# Patient Record
Sex: Male | Born: 1980 | Race: Black or African American | Hispanic: No | Marital: Married | Smoking: Current some day smoker
Health system: Southern US, Community
[De-identification: ages and names within clinical notes are randomized; demographics above are authoritative.]

## PROBLEM LIST (undated history)

## (undated) ENCOUNTER — Ambulatory Visit

## (undated) HISTORY — PX: HERNIA REPAIR: SHX51

---

## 1998-09-02 ENCOUNTER — Encounter: Payer: Self-pay | Admitting: Emergency Medicine

## 1998-09-02 ENCOUNTER — Emergency Department (HOSPITAL_COMMUNITY): Admission: EM | Admit: 1998-09-02 | Discharge: 1998-09-02 | Payer: Self-pay | Admitting: Emergency Medicine

## 1998-12-17 ENCOUNTER — Emergency Department (HOSPITAL_COMMUNITY): Admission: EM | Admit: 1998-12-17 | Discharge: 1998-12-17 | Payer: Self-pay | Admitting: Emergency Medicine

## 1998-12-17 ENCOUNTER — Encounter: Payer: Self-pay | Admitting: Emergency Medicine

## 1999-03-05 ENCOUNTER — Encounter: Payer: Self-pay | Admitting: Emergency Medicine

## 1999-03-05 ENCOUNTER — Emergency Department (HOSPITAL_COMMUNITY): Admission: EM | Admit: 1999-03-05 | Discharge: 1999-03-05 | Payer: Self-pay | Admitting: Emergency Medicine

## 2000-12-17 ENCOUNTER — Emergency Department (HOSPITAL_COMMUNITY): Admission: EM | Admit: 2000-12-17 | Discharge: 2000-12-17 | Payer: Self-pay | Admitting: Emergency Medicine

## 2001-08-23 ENCOUNTER — Emergency Department (HOSPITAL_COMMUNITY): Admission: EM | Admit: 2001-08-23 | Discharge: 2001-08-23 | Payer: Self-pay | Admitting: Emergency Medicine

## 2001-08-24 ENCOUNTER — Emergency Department (HOSPITAL_COMMUNITY): Admission: EM | Admit: 2001-08-24 | Discharge: 2001-08-24 | Payer: Self-pay | Admitting: Emergency Medicine

## 2001-12-29 ENCOUNTER — Encounter: Payer: Self-pay | Admitting: Emergency Medicine

## 2001-12-29 ENCOUNTER — Emergency Department (HOSPITAL_COMMUNITY): Admission: EM | Admit: 2001-12-29 | Discharge: 2001-12-29 | Payer: Self-pay | Admitting: *Deleted

## 2003-10-18 ENCOUNTER — Emergency Department (HOSPITAL_COMMUNITY): Admission: EM | Admit: 2003-10-18 | Discharge: 2003-10-18 | Payer: Self-pay | Admitting: Emergency Medicine

## 2008-05-03 ENCOUNTER — Emergency Department (HOSPITAL_COMMUNITY): Admission: EM | Admit: 2008-05-03 | Discharge: 2008-05-03 | Payer: Self-pay | Admitting: Family Medicine

## 2009-05-20 ENCOUNTER — Emergency Department (HOSPITAL_COMMUNITY): Admission: EM | Admit: 2009-05-20 | Discharge: 2009-05-20 | Payer: Self-pay | Admitting: Family Medicine

## 2009-10-26 ENCOUNTER — Emergency Department (HOSPITAL_COMMUNITY): Admission: EM | Admit: 2009-10-26 | Discharge: 2009-10-26 | Payer: Self-pay | Admitting: Family Medicine

## 2010-01-21 ENCOUNTER — Emergency Department (HOSPITAL_COMMUNITY): Admission: EM | Admit: 2010-01-21 | Discharge: 2010-01-22 | Payer: Self-pay | Admitting: Emergency Medicine

## 2010-01-23 ENCOUNTER — Emergency Department (HOSPITAL_COMMUNITY): Admission: EM | Admit: 2010-01-23 | Discharge: 2010-01-23 | Payer: Self-pay | Admitting: Family Medicine

## 2010-07-23 ENCOUNTER — Emergency Department (HOSPITAL_COMMUNITY): Admission: EM | Admit: 2010-07-23 | Discharge: 2010-07-23 | Payer: Self-pay | Admitting: Emergency Medicine

## 2010-09-15 ENCOUNTER — Emergency Department (HOSPITAL_COMMUNITY)
Admission: EM | Admit: 2010-09-15 | Discharge: 2010-09-15 | Payer: Self-pay | Source: Home / Self Care | Admitting: Emergency Medicine

## 2010-10-26 ENCOUNTER — Emergency Department (HOSPITAL_COMMUNITY)
Admission: EM | Admit: 2010-10-26 | Discharge: 2010-10-26 | Disposition: A | Discharge status: Home / Self Care | Payer: Self-pay | Attending: Emergency Medicine | Admitting: Emergency Medicine

## 2010-10-26 DIAGNOSIS — R197 Diarrhea, unspecified: Secondary | ICD-10-CM | POA: Insufficient documentation

## 2010-10-26 DIAGNOSIS — R112 Nausea with vomiting, unspecified: Secondary | ICD-10-CM | POA: Insufficient documentation

## 2010-10-26 LAB — COMPREHENSIVE METABOLIC PANEL
AST: 34 U/L (ref 0–37)
Albumin: 3.7 g/dL (ref 3.5–5.2)
BUN: 8 mg/dL (ref 6–23)
Calcium: 9 mg/dL (ref 8.4–10.5)
Creatinine, Ser: 1.06 mg/dL (ref 0.4–1.5)
GFR calc Af Amer: >60 mL/min (ref >60)
Total Protein: 7 g/dL (ref 6.0–8.3)

## 2010-10-26 LAB — CBC
HCT: 42.7 % (ref 39.0–52.0)
Hemoglobin: 14.4 g/dL (ref 13.0–17.0)
MCH: 27.7 pg (ref 26.0–34.0)
MCHC: 33.7 g/dL (ref 30.0–36.0)
RBC: 5.19 MIL/uL (ref 4.22–5.81)

## 2010-10-26 LAB — DIFFERENTIAL
Lymphocytes Relative: 49 % — ABNORMAL HIGH (ref 12–46)
Monocytes Absolute: 0.3 10*3/uL (ref 0.1–1.0)
Monocytes Relative: 8 % (ref 3–12)
Neutro Abs: 1.4 10*3/uL — ABNORMAL LOW (ref 1.7–7.7)
Neutrophils Relative %: 36 % — ABNORMAL LOW (ref 43–77)

## 2011-10-09 ENCOUNTER — Emergency Department (HOSPITAL_COMMUNITY): Payer: Self-pay

## 2011-10-09 ENCOUNTER — Encounter (HOSPITAL_COMMUNITY): Payer: Self-pay | Admitting: Emergency Medicine

## 2011-10-09 ENCOUNTER — Emergency Department (HOSPITAL_COMMUNITY)
Admission: EM | Admit: 2011-10-09 | Discharge: 2011-10-09 | Disposition: A | Discharge status: Home / Self Care | Payer: Self-pay | Attending: Emergency Medicine | Admitting: Emergency Medicine

## 2011-10-09 DIAGNOSIS — R1110 Vomiting, unspecified: Secondary | ICD-10-CM | POA: Insufficient documentation

## 2011-10-09 DIAGNOSIS — Z79899 Other long term (current) drug therapy: Secondary | ICD-10-CM | POA: Insufficient documentation

## 2011-10-09 DIAGNOSIS — R070 Pain in throat: Secondary | ICD-10-CM | POA: Insufficient documentation

## 2011-10-09 DIAGNOSIS — R05 Cough: Secondary | ICD-10-CM | POA: Insufficient documentation

## 2011-10-09 DIAGNOSIS — R059 Cough, unspecified: Secondary | ICD-10-CM | POA: Insufficient documentation

## 2011-10-09 DIAGNOSIS — J40 Bronchitis, not specified as acute or chronic: Secondary | ICD-10-CM | POA: Insufficient documentation

## 2011-10-09 DIAGNOSIS — J45909 Unspecified asthma, uncomplicated: Secondary | ICD-10-CM | POA: Insufficient documentation

## 2011-10-09 MED ORDER — AZITHROMYCIN 250 MG PO TABS: 250.0000 mg | ORAL_TABLET | Freq: Every day | ORAL | Status: DC

## 2011-10-09 MED ORDER — HYDROCOD POLST-CHLORPHEN POLST 10-8 MG/5ML PO LQCR: 5.0000 mL | Freq: Every evening | ORAL | Status: DC | PRN

## 2011-10-09 MED ORDER — HYDROCOD POLST-CHLORPHEN POLST 10-8 MG/5ML PO LQCR
5.0000 mL | Freq: Once | ORAL | Status: AC
  Administered 2011-10-09: 5 mL via ORAL
  Filled 2011-10-09: qty 5

## 2011-10-09 MED ORDER — AZITHROMYCIN 250 MG PO TABS: 250.0000 mg | ORAL_TABLET | Freq: Every day | ORAL | Status: AC

## 2011-10-09 MED ORDER — AZITHROMYCIN 250 MG PO TABS
250.0000 mg | ORAL_TABLET | Freq: Once | ORAL | Status: AC
  Administered 2011-10-09: 250 mg via ORAL
  Filled 2011-10-09: qty 1

## 2011-10-09 NOTE — ED Provider Notes (Signed)
History     CSN: [id number]  Arrival date & time 10/09/11  1955   First MD Initiated Contact with Patient 10/09/11 2204      Chief Complaint  Patient presents with  . URI    (Consider location/radiation/quality/duration/timing/severity/associated sxs/prior treatment) HPI  Pt presents to the ED with complaints of flu-like symptoms of cough,  sore throat, and vomiting after coughing. The patient states that the symptoms started 3 weeks ago.  Pt has been around other sick contacts and did not get the flu shot this year. The patient denies headaches, neck pain, weakness, vision changes, severe abdominal pain, inability to eat or drink, difficulty breathing, SOB, wheezing, chest pain. The patient has tried cough medicine, NSAIDS, and rest but has only felt mild relief.    Past Medical History  Diagnosis Date  . Asthma     History reviewed. No pertinent past surgical history.  No family history on file.  History  Substance Use Topics  . Smoking status: Current Everyday Smoker -- 1.0 packs/day    Types: Cigarettes  . Smokeless tobacco: Not on file  . Alcohol Use: No      Review of Systems  All other systems reviewed and are negative.    Allergies  Review of patient's allergies indicates no known allergies.  Home Medications   Current Outpatient Rx  Name Route Sig Dispense Refill  . AZITHROMYCIN 250 MG PO TABS Oral Take 1 tablet (250 mg total) by mouth daily. Take first 2 tablets together, then 1 every day until finished. 5 tablet 0  . HYDROCOD POLST-CPM POLST ER 10-8 MG/5ML PO LQCR Oral Take 5 mLs by mouth at bedtime as needed. 140 mL 0    BP 120/71  Pulse 76  Temp(Src) 98 F (36.7 C) (Oral)  Resp 16  Wt 215 lb (97.523 kg)  SpO2 99%  Physical Exam  Nursing note and vitals reviewed. Constitutional: He is oriented to person, place, and time. He appears well-developed and well-nourished.  HENT:  Head: Normocephalic and atraumatic.  Eyes: Conjunctivae are  normal. Pupils are equal, round, and reactive to light.  Neck: Normal range of motion. Neck supple.  Cardiovascular: Normal rate and regular rhythm.   Pulmonary/Chest: Effort normal and breath sounds normal. No respiratory distress. He has no wheezes. He has no rales. He exhibits no tenderness.  Abdominal: Soft.  Musculoskeletal: Normal range of motion.  Neurological: He is oriented to person, place, and time.  Skin: Skin is warm and dry.    ED Course  Procedures (including critical care time)  Labs Reviewed - No data to display Dg Chest 2 View  10/09/2011  *RADIOLOGY REPORT*  Clinical Data: Cough  CHEST - 2 VIEW  Comparison: 07/23/2010; 10/18/2003  Findings: Unchanged cardiac silhouette and mediastinal contours. There is mild diffuse increased conspicuity of the pulmonary interstitium.  No focal airspace opacities.  No pleural effusion or pneumothorax. Mild scoliotic curvature of the thoracolumbar spine, possibly positional.  No acute osseous abnormalities.  IMPRESSION: Findings suggestive of airways disease/bronchitis.  No focal airspace opacities to suggest pneumonia.  Original Report Authenticated By: Waynard Reeds, M.D.     1. Bronchitis       MDM  Pt given first dose of cough medication and abx (azithro) in ED. Pt sent home with Rx for tussionex and azithro for 6 days.        Dorthula Matas, PA 10/09/11 2237

## 2011-10-09 NOTE — Discharge Instructions (Signed)

## 2011-10-09 NOTE — ED Notes (Signed)
Pt alert, nad, c/o cough, URI s/s, onset several days ago, resp eve ununlabored, dry npc noted, skin pwd

## 2011-10-13 NOTE — ED Provider Notes (Signed)
History/physical exam/procedure(s) were performed by non-physician practitioner and as supervising physician I was immediately available for consultation/collaboration. I have reviewed all notes and am in agreement with care and plan.   Hilario Quarry, MD 10/13/11 [phone]

## 2012-03-31 ENCOUNTER — Encounter (HOSPITAL_COMMUNITY): Payer: Self-pay | Admitting: *Deleted

## 2012-03-31 ENCOUNTER — Emergency Department (HOSPITAL_COMMUNITY): Payer: Self-pay

## 2012-03-31 ENCOUNTER — Emergency Department (HOSPITAL_COMMUNITY)
Admission: EM | Admit: 2012-03-31 | Discharge: 2012-03-31 | Disposition: A | Discharge status: Home / Self Care | Payer: Self-pay | Attending: Emergency Medicine | Admitting: Emergency Medicine

## 2012-03-31 DIAGNOSIS — S6390XA Sprain of unspecified part of unspecified wrist and hand, initial encounter: Secondary | ICD-10-CM | POA: Insufficient documentation

## 2012-03-31 DIAGNOSIS — S60229A Contusion of unspecified hand, initial encounter: Secondary | ICD-10-CM | POA: Insufficient documentation

## 2012-03-31 DIAGNOSIS — IMO0002 Reserved for concepts with insufficient information to code with codable children: Secondary | ICD-10-CM | POA: Insufficient documentation

## 2012-03-31 DIAGNOSIS — S60519A Abrasion of unspecified hand, initial encounter: Secondary | ICD-10-CM

## 2012-03-31 DIAGNOSIS — J45909 Unspecified asthma, uncomplicated: Secondary | ICD-10-CM | POA: Insufficient documentation

## 2012-03-31 DIAGNOSIS — F17200 Nicotine dependence, unspecified, uncomplicated: Secondary | ICD-10-CM | POA: Insufficient documentation

## 2012-03-31 MED ORDER — IBUPROFEN 800 MG PO TABS: 800.0000 mg | ORAL_TABLET | Freq: Three times a day (TID) | ORAL | Status: AC | PRN

## 2012-03-31 MED ORDER — HYDROCODONE-ACETAMINOPHEN 5-325 MG PO TABS: ORAL_TABLET | ORAL | Status: AC

## 2012-03-31 MED ORDER — TETANUS-DIPHTH-ACELL PERTUSSIS 5-2.5-18.5 LF-MCG/0.5 IM SUSP
0.5000 mL | Freq: Once | INTRAMUSCULAR | Status: AC
  Administered 2012-03-31: 0.5 mL via INTRAMUSCULAR
  Filled 2012-03-31: qty 0.5

## 2012-03-31 MED ORDER — AMOXICILLIN-POT CLAVULANATE 875-125 MG PO TABS: 1.0000 | ORAL_TABLET | Freq: Two times a day (BID) | ORAL | Status: AC

## 2012-03-31 MED ORDER — HYDROCODONE-ACETAMINOPHEN 5-325 MG PO TABS
2.0000 | ORAL_TABLET | Freq: Once | ORAL | Status: AC
  Administered 2012-03-31: 2 via ORAL
  Filled 2012-03-31: qty 2

## 2012-03-31 MED ORDER — AMOXICILLIN-POT CLAVULANATE 875-125 MG PO TABS
1.0000 | ORAL_TABLET | Freq: Once | ORAL | Status: AC
  Administered 2012-03-31: 1 via ORAL
  Filled 2012-03-31: qty 1
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
## 2012-03-31 NOTE — ED Provider Notes (Signed)
Medical screening examination/treatment/procedure(s) were performed by non-physician practitioner and as supervising physician I was immediately available for consultation/collaboration.   Andreka Stucki, MD 03/31/12 2230 
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
## 2012-03-31 NOTE — Discharge Instructions (Signed)
Please read and follow all provided instructions.  Your diagnoses today include:  1. Contusion of hand   2. Sprain of hand   3. Abrasion of hand     Tests performed today include:  An x-ray of your hand and forearm - does NOT show any broken bones  Vital signs. See below for your results today.   Medications prescribed:   Augmentin - antibiotic  You have been prescribed an antibiotic medicine: take the entire course of medicine even if you are feeling better. Stopping early can cause the antibiotic not to work.   Vicodin (hydrocodone/acetaminophen) - narcotic pain medication  DO NOT drive or perform any activities that require you to be awake and alert because this medicine can make you drowsy. BE VERY CAREFUL not to take multiple medicines containing Tylenol (also called acetaminophen). Doing so can lead to an overdose which can damage your liver and cause liver failure and possibly death.   Ibuprofen (Motrin, Advil) - anti-inflammatory pain medication  Do not exceed 800mg  ibuprofen every 8 hours, take with food  You have been prescribed an anti-inflammatory medication or NSAID. Take with food. Take smallest effective dose for the shortest duration needed for your pain. Stop taking if you experience stomach pain or vomiting.   Take any prescribed medications only as directed.  Home care instructions:   Follow any educational materials contained in this packet  Wear your splint for at least one week or until seen by a physician for a follow-up examination.  Follow R.I.C.E. Protocol:  R - rest your injury   I  - use ice on injury without applying directly to skin  C - compress injury with bandage or splint  E - elevate the injury above the level of your heart as much as possible to reduce pain and swelling  Follow-up instructions: Please follow-up with your primary care provider or the provided orthopedic (bone specialist) if you continue to have significant pain or  trouble using your wrist in 1 week. In this case you may have a severe injury that requires further care.   Generally, when wrists are moderately tender to touch following a fall or injury, a fracture (break in bone) may be present. Because of this, even if your x-rays were normal today, it is important that you receive follow-up care as suggested (you could still have a broken bone).  If you do not have a primary care doctor -- see below for referral information.   Return instructions:   Please return if your fingers are numb or tingling, appear very red, white, gray or blue, or you have severe pain (also elevate wrist and loosen splint or wrap)  Please return if you have difficulty moving your fingers.  Return with you have increasing pain, swelling, redness, or pus draining from the wound on your knuckle  Please return to the Emergency Department if you experience worsening symptoms.   Please return if you have any other emergent concerns.  Additional Information:  Your vital signs today were: BP 118/74   Pulse 84   Temp 98 F (36.7 C) (Oral)   Resp 16   SpO2 99% If your blood pressure (BP) was elevated above 135/85 this visit, please have this repeated by your doctor within one month. -------------- Wrist injuries are frequent in adults and children. A sprain is an injury to the ligaments that hold your bones together. A strain is an injury to muscle or muscle tendons (cord like structure) from stretching or  pulling.   Remember the importance of follow-up and possible follow-up x-rays. Improvement in pain level is not 100% insurance of not having a fracture. -------------- RESOURCE GUIDE  Chronic Pain Problems:  [facility]:  [phone]  Patients need to be referred by their primary care doctor  Insufficient Money for Medicine:  [employer]:     call "211"  [facility]:  602-030-2337  No Primary Care Doctor:  To locate a primary care doctor that  accepts your insurance or provides certain services:  [facility] :   [phone]  Physician Referral Service:  [phone]  Agencies that provide inexpensive medical care:  [facility]:   [phone]  [facility]:   [phone]  [facility]:   [phone]  Women's Clinic:     [phone]  Planned Parenthood:    [phone]  [facility]:     [phone]  Medicaid-accepting [facility] Providers:  [facility]  [address], [phone], Mon-Fri 9am-7pm, Sat 9am-1pm  [facility]  [address] [place], [address] [place], [phone]  [facility]  [address] [place], [phone]  [facility]  [address] [phone]  Renaye Rakers  [address], [phone]  Only accepts [place] [employer] patients after they have their name  applied to their card  Self Pay (no insurance) in [facility]:  Sickle Cell Patients: Dr. Willey Blade, [facility]  [address] [place], [phone]  [facility]  [address] [place], [phone]  [facility] [place]  [address] [place] [facility] [address], [facility] - [address]  [phone], Mon-Fri 9am-7pm, Sat 9am-1pm  [facility]  [address] [place], [phone]  [facility]  [address], [phone]  [facility]  [address] [phone]  Dr. Julio Sicks  [address] [place] [address], [place], [phone]  [facility]  [address] [phone]  [facility]  [address] [phone]  [address] [phone]  [facility]  [address] [place], [phone], 1st & 3rd Saturday every month, 10am-1pm  Strategies for finding a Primary Care Provider:  1) Find a Doctor and Pay Out of Pocket Although you  won't have to find out who is covered by your insurance plan, it is a good idea to ask around and get recommendations. You will then need to call the office and see if the doctor you have chosen will accept you as a new patient and what types of options they offer for patients who are self-pay. Some doctors offer discounts or will set up payment plans for their patients who do not have insurance, but you will need to ask so you aren't surprised when you get to your appointment.  2) Contact Your Local Health Department Not all health departments have doctors that can see patients for sick visits, but many do, so it is worth a call to see if yours does. If you don't know where your local health department is, you can check in your phone book. The CDC also has a tool to help you locate your state's health department, and many state websites also have listings of all of their local health departments.  3) Find a [employer]  If your illness is not likely to be very severe or complicated, you may want to try a walk in clinic. These are popping up all over the country in pharmacies, drugstores, and shopping centers. They're usually staffed by nurse practitioners or physician assistants that have been trained to treat common illnesses and complaints. They're usually fairly quick and inexpensive. However, if you have serious medical issues or chronic medical problems, these are probably not your best option  STD Testing:  [facility] [place], [place] [facility]  [address] [place], phone [phone] or [phone]    Monday - Friday, call for an appointment  [facility] Department of [facility], [place] [facility]  [address], [place], phone [phone] or [phone]   Monday - Friday, call for an appointment  Abuse/Neglect:  [facility] Child Abuse Hotline:  949-732-1722  [facility] Child Abuse Hotline: 5701573583 (After  Hours)  Emergency Shelter:  [facility] (779)125-0554  Maternity Homes:  [facility] [place] [facility]: (906)197-0915  [facility] Services: 330-025-7502  MRSA Hotline:   859-416-6837   [facility]  [address] [place]  [phone]   [place]  [address] [place], [place]  [phone]   [facility] Dept.  [address] [place] [address], [place]  [phone]   [facility]  [phone]   [facility]  [phone]   [facility] in [place]  [address]  [phone], [facility] Child Abuse Hotline  (434)658-4954  201-391-9064 (After Hours)   Behavioral Health Services  Substance Abuse Resources:  Alcohol and Drug Services:     702-303-2912  [facility]:   [phone]  [facility]:     [phone]  [facility]:      [phone]  Residential & Outpatient Substance Abuse Program: (734)761-7272  Psychological Services:  [facility]:   [phone]  [facility] Services:    [phone]  [facility]  [address], [place]  ACCESS LINE: (802)192-9188 or 6104886589  [employer]  Dental Assistance: If unable to pay or uninsured, contact:  [facility] or [facility]. to become qualified for the adult dental clinic.  Patients with Medicaid  [facility]  [phone] [address], [phone]  [address] [phone]  If unable to pay, or uninsured: contact [facility] [phone]) or [facility] Department [phone] in [place], [phone] in [facility]) to become qualified for the adult dental clinic  Other Low-Cost Community Dental Services:  [facility]  [address] [place], [place], [place], [address]  [phone],  [phone]  2nd and 4th Thursday of the month at [facility]  [facility]  [address] [place], [place], [place], [address]  [phone]  [facility]  [address] [place], [place], [address]  [phone]  [facility] Health Department  [phone]  [facility]  [phone]  [facility]  [phone]

## 2013-04-24 ENCOUNTER — Encounter (HOSPITAL_COMMUNITY): Payer: Self-pay | Admitting: *Deleted

## 2013-04-24 ENCOUNTER — Emergency Department (HOSPITAL_COMMUNITY)
Admission: EM | Admit: 2013-04-24 | Discharge: 2013-04-24 | Disposition: A | Discharge status: Home / Self Care | Payer: Self-pay | Attending: Emergency Medicine | Admitting: Emergency Medicine

## 2013-04-24 DIAGNOSIS — F17200 Nicotine dependence, unspecified, uncomplicated: Secondary | ICD-10-CM | POA: Insufficient documentation

## 2013-04-24 DIAGNOSIS — L02619 Cutaneous abscess of unspecified foot: Secondary | ICD-10-CM | POA: Insufficient documentation

## 2013-04-24 DIAGNOSIS — L539 Erythematous condition, unspecified: Secondary | ICD-10-CM | POA: Insufficient documentation

## 2013-04-24 DIAGNOSIS — J45909 Unspecified asthma, uncomplicated: Secondary | ICD-10-CM | POA: Insufficient documentation

## 2013-04-24 DIAGNOSIS — L03039 Cellulitis of unspecified toe: Secondary | ICD-10-CM | POA: Insufficient documentation

## 2013-04-24 DIAGNOSIS — R609 Edema, unspecified: Secondary | ICD-10-CM | POA: Insufficient documentation

## 2013-04-24 DIAGNOSIS — M7750 Other enthesopathy of unspecified foot: Secondary | ICD-10-CM | POA: Insufficient documentation

## 2013-04-24 MED ORDER — SULFAMETHOXAZOLE-TRIMETHOPRIM 800-160 MG PO TABS: 1.0000 | ORAL_TABLET | Freq: Two times a day (BID) | ORAL | Status: DC

## 2013-04-24 MED ORDER — NAPROXEN 500 MG PO TABS: 500.0000 mg | ORAL_TABLET | Freq: Two times a day (BID) | ORAL | Status: DC

## 2013-04-24 NOTE — ED Provider Notes (Signed)
Medical screening examination/treatment/procedure(s) were performed by non-physician practitioner and as supervising physician I was immediately available for consultation/collaboration.   Benny Lennert, MD 04/24/13 [phone]

## 2013-04-24 NOTE — ED Notes (Signed)
C/o left 3rd toe pain onset yesterday - denies injury.  Reports now entire left foot is painful/sore.

## 2013-04-24 NOTE — ED Provider Notes (Signed)
CSN: [id number]     Arrival date & time 04/24/13  0808 History   None    Chief Complaint  Patient presents with  . Foot Pain   (Consider location/radiation/quality/duration/timing/severity/associated sxs/prior Treatment) HPI Comments: Chris Glover is a 32 y.o. male who presents to the Emergency Department complaining of pain to the left third toe.  Noticed pain, swelling and redness to the toe yesterday.  Denies known injury.  States that the shoes he wears to work "rub" his toe.  He denies numbness, open wounds to the foot or toe, or red streaks.  He soaked his foot in warm epsom salt last evening which helped the pain.  No h/x of DM  Patient is a 32 y.o. male presenting with lower extremity pain. The history is provided by the patient.  Foot Pain This is a new problem. The current episode started yesterday. The problem occurs constantly. The problem has been unchanged. Associated symptoms include arthralgias and joint swelling. Pertinent negatives include no chills, fever, nausea, numbness, rash, vomiting or weakness. The symptoms are aggravated by walking and standing. He has tried nothing for the symptoms. The treatment provided no relief.    Past Medical History  Diagnosis Date  . Asthma    Past Surgical History  Procedure Laterality Date  . Hernia repair     No family history on file. History  Substance Use Topics  . Smoking status: Current Every Day Smoker -- 1.00 packs/day    Types: Cigarettes  . Smokeless tobacco: Not on file  . Alcohol Use: Yes    Review of Systems  Constitutional: Negative for fever and chills.  Gastrointestinal: Negative for nausea and vomiting.  Genitourinary: Negative for dysuria and difficulty urinating.  Musculoskeletal: Positive for joint swelling and arthralgias. Negative for gait problem.  Skin: Negative for color change, rash and wound.  Neurological: Negative for weakness and numbness.  All other systems reviewed and are  negative.    Allergies  Review of patient's allergies indicates no known allergies.  Home Medications  No current outpatient prescriptions on file.  BP 128/86  Temp(Src) 97.7 F (36.5 C) (Oral)  Ht 6\' 1"  (1.854 m)  Wt 205 lb (92.987 kg)  BMI 27.05 kg/m2  SpO2 100%  Physical Exam  Nursing note and vitals reviewed. Constitutional: He is oriented to person, place, and time.  Cardiovascular: Normal rate, regular rhythm, normal heart sounds and intact distal pulses.   No murmur heard. Pulmonary/Chest: Effort normal and breath sounds normal. No respiratory distress.  Musculoskeletal: He exhibits edema and tenderness.       Right foot: He exhibits tenderness and swelling. He exhibits normal range of motion, no bony tenderness, normal capillary refill, no crepitus, no deformity and no laceration.       Feet:  Localized ttp of the dorsal surface of the left third toe.  Mild STS and erythema present.  No wounds, open lesions, drainage, paronychia, or red streaking.  DP pulse brisk, distal sensation intact.  No proximal tenderness.    Neurological: He is alert and oriented to person, place, and time. He exhibits normal muscle tone. Coordination normal.  Skin: Skin is warm and dry. No rash noted.  See MS exam    ED Course  Procedures (including critical care time)  DIAGNOSTIC STUDIES: Oxygen Saturation is 100% on RA, normal by my interpretation.    COORDINATION OF CARE: 8:23 AM-Discussed treatment plan which includes  (X-ray) with pt at bedside and pt agreed to plan.  Labs Review Labs Reviewed - No data to display Imaging Review No results found.  MDM    Localized erythema and mild STS of the dorsal third toe of the left foot.  Possible irritation caused by poor fitting shoes, but also early cellulitis of the toe.  Will treat with naprosyn and Bactrim.  Pt agrees to elevate and continue warm water soaks.  He agrees to return if the sx's worsen.  VSS.  He appears stable for  discharge.   Vidur Knust L. Riaan Toledo, PA-C 04/24/13 0842  Dow Blahnik L. Trisha Mangle, PA-C 04/24/13 [phone]

## 2013-04-24 NOTE — ED Notes (Signed)
Instructions, prescriptions and f/u information given/reviewed - verbalizes understanding.  

## 2013-05-11 ENCOUNTER — Emergency Department (HOSPITAL_COMMUNITY)
Admission: EM | Admit: 2013-05-11 | Discharge: 2013-05-11 | Disposition: A | Discharge status: Home / Self Care | Payer: Self-pay | Attending: Emergency Medicine | Admitting: Emergency Medicine

## 2013-05-11 ENCOUNTER — Encounter (HOSPITAL_COMMUNITY): Payer: Self-pay

## 2013-05-11 DIAGNOSIS — R509 Fever, unspecified: Secondary | ICD-10-CM | POA: Insufficient documentation

## 2013-05-11 DIAGNOSIS — R1110 Vomiting, unspecified: Secondary | ICD-10-CM

## 2013-05-11 DIAGNOSIS — R197 Diarrhea, unspecified: Secondary | ICD-10-CM | POA: Insufficient documentation

## 2013-05-11 DIAGNOSIS — J45909 Unspecified asthma, uncomplicated: Secondary | ICD-10-CM | POA: Insufficient documentation

## 2013-05-11 DIAGNOSIS — R109 Unspecified abdominal pain: Secondary | ICD-10-CM | POA: Insufficient documentation

## 2013-05-11 DIAGNOSIS — R112 Nausea with vomiting, unspecified: Secondary | ICD-10-CM | POA: Insufficient documentation

## 2013-05-11 DIAGNOSIS — Z79899 Other long term (current) drug therapy: Secondary | ICD-10-CM | POA: Insufficient documentation

## 2013-05-11 DIAGNOSIS — F17200 Nicotine dependence, unspecified, uncomplicated: Secondary | ICD-10-CM | POA: Insufficient documentation

## 2013-05-11 NOTE — ED Notes (Signed)
Pt reports being sick w/ nausea, vomiting and diarrhea since Saturday.  Stated he is getting better, but wanted to get checked out. Has to go back to work this week.  Denies any fever.  Son has been sick with the same thing

## 2013-05-11 NOTE — ED Provider Notes (Signed)
CSN: [id number]     Arrival date & time 05/11/13  4098 History  This chart was scribed for Candyce Churn, MD by Quintella Reichert, ED scribe.  This patient was seen in room APA03/APA03 and the patient's care was started at 7:46 AM.   Chief Complaint  Patient presents with  . Nausea  . Emesis  . Diarrhea    Patient is a 32 y.o. male presenting with vomiting. The history is provided by the patient. No language interpreter was used.  Emesis Severity:  Moderate Duration:  5 days Timing:  Intermittent Emesis appearance: Not bloody. Able to tolerate:  Solids and liquids (Presently.  Earlier unable to tolerate solids.) Progression:  Partially resolved Chronicity:  New Relieved by:  Nothing Worsened by:  Nothing tried Ineffective treatments:  None tried Associated symptoms: abdominal pain, diarrhea and fever (possible transient low-grade fever, not measured)   Risk factors: sick contacts     HPI Comments: Chris Glover is a 32 y.o. male who presents to the Emergency Department complaining of 4-5 days of gradually-improving nausea, vomiting and diarrhea.  Pt reports that his son was recently sick with the same symptoms.  He states his symptoms are improving but he still wants to get checked   His last episode of vomiting and diarrhea was last night and he has had none since then.  He denies blood in vomit or stool.   He states he may have had a low-grade fever 4 days ago but none since then.  He also notes that he had some abdominal pain that has since resolved.  He denies lightheadedness.  Pt was unable to tolerate food earlier but states this has also resolved.  He has been staying well-hydrated.  Pt notes that he has to go back to work this week.  He is a [employer].     Past Medical History  Diagnosis Date  . Asthma     Past Surgical History  Procedure Laterality Date  . Hernia repair      No family history on file.   History  Substance Use Topics  . Smoking status:  Current Every Day Smoker -- 1.00 packs/day    Types: Cigarettes  . Smokeless tobacco: Not on file  . Alcohol Use: Yes     Review of Systems  Constitutional: Positive for fever.  Gastrointestinal: Positive for nausea, vomiting, abdominal pain and diarrhea. Negative for blood in stool.  Neurological: Negative for light-headedness.  All other systems reviewed and are negative.     Allergies  Review of patient's allergies indicates no known allergies.  Home Medications   Current Outpatient Rx  Name  Route  Sig  Dispense  Refill  . naproxen (NAPROSYN) 500 MG tablet   Oral   Take 1 tablet (500 mg total) by mouth 2 (two) times daily with a meal.   20 tablet   0   . sulfamethoxazole-trimethoprim (SEPTRA DS) 800-160 MG per tablet   Oral   Take 1 tablet by mouth 2 (two) times daily.   20 tablet   0    BP 133/86  Pulse 70  Temp(Src) 97.8 F (36.6 C) (Oral)  Resp 18  Ht 6\' 1"  (1.854 m)  Wt 214 lb (97.07 kg)  BMI 28.24 kg/m2  SpO2 99%  Physical Exam  Nursing note and vitals reviewed. Constitutional: He is oriented to person, place, and time. He appears well-developed and well-nourished. No distress.  HENT:  Head: Normocephalic and atraumatic.  Mouth/Throat: Oropharynx is  clear and moist.  Eyes: Conjunctivae are normal. Pupils are equal, round, and reactive to light. No scleral icterus.  Neck: Normal range of motion. Neck supple.  Cardiovascular: Normal rate, regular rhythm, normal heart sounds and intact distal pulses.   No murmur heard. Pulmonary/Chest: Effort normal and breath sounds normal. No stridor. No respiratory distress. He has no wheezes. He has no rales.  Abdominal: Soft. Bowel sounds are normal. He exhibits no distension. There is no tenderness. There is no rebound and no guarding.  Musculoskeletal: Normal range of motion. He exhibits no edema.  Neurological: He is alert and oriented to person, place, and time.  Skin: Skin is warm and dry. No rash noted.   Psychiatric: He has a normal mood and affect. His behavior is normal.    ED Course  Procedures (including critical care time)  DIAGNOSTIC STUDIES: Oxygen Saturation is 99% on room air, normal by my interpretation.    COORDINATION OF CARE: 7:55 AM-Discussed treatment plan with pt at bedside and pt agreed to plan.    Labs Review Labs Reviewed - No data to display  Imaging Review No results found.  MDM   1. Vomiting and diarrhea    Well-appearing 32 year old male with vomiting and diarrhea for several days. Last episode of fever was yesterday. He states he feels much better today. Tolerating POs. Abdomen soft and nontender.    I personally performed the services described in this documentation, which was scribed in my presence. The recorded information has been reviewed and is accurate.     Candyce Churn, MD 05/11/13 [phone]
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
## 2014-09-05 ENCOUNTER — Emergency Department (HOSPITAL_COMMUNITY)
Admission: EM | Admit: 2014-09-05 | Discharge: 2014-09-05 | Disposition: A | Discharge status: Home / Self Care | Payer: Self-pay | Attending: Emergency Medicine | Admitting: Emergency Medicine

## 2014-09-05 ENCOUNTER — Encounter (HOSPITAL_COMMUNITY): Payer: Self-pay | Admitting: *Deleted

## 2014-09-05 DIAGNOSIS — Z791 Long term (current) use of non-steroidal anti-inflammatories (NSAID): Secondary | ICD-10-CM | POA: Insufficient documentation

## 2014-09-05 DIAGNOSIS — K047 Periapical abscess without sinus: Secondary | ICD-10-CM | POA: Insufficient documentation

## 2014-09-05 DIAGNOSIS — J45909 Unspecified asthma, uncomplicated: Secondary | ICD-10-CM | POA: Insufficient documentation

## 2014-09-05 DIAGNOSIS — Z720 Tobacco use: Secondary | ICD-10-CM | POA: Insufficient documentation

## 2014-09-05 DIAGNOSIS — K029 Dental caries, unspecified: Secondary | ICD-10-CM | POA: Insufficient documentation

## 2014-09-05 DIAGNOSIS — Z792 Long term (current) use of antibiotics: Secondary | ICD-10-CM | POA: Insufficient documentation

## 2014-09-05 MED ORDER — IBUPROFEN 600 MG PO TABS: 600.0000 mg | ORAL_TABLET | Freq: Three times a day (TID) | ORAL | Status: DC | PRN

## 2014-09-05 MED ORDER — PENICILLIN V POTASSIUM 250 MG PO TABS
500.0000 mg | ORAL_TABLET | Freq: Once | ORAL | Status: AC
  Administered 2014-09-05: 500 mg via ORAL
  Filled 2014-09-05: qty 2

## 2014-09-05 MED ORDER — IBUPROFEN 400 MG PO TABS
600.0000 mg | ORAL_TABLET | Freq: Once | ORAL | Status: AC
  Administered 2014-09-05: 600 mg via ORAL
  Filled 2014-09-05: qty 2

## 2014-09-05 MED ORDER — PENICILLIN V POTASSIUM 500 MG PO TABS: 500.0000 mg | ORAL_TABLET | Freq: Three times a day (TID) | ORAL | Status: AC

## 2014-09-05 NOTE — ED Provider Notes (Signed)
CSN: [id number]     Arrival date & time 09/05/14  0307 History   First MD Initiated Contact with Patient 09/05/14 0334     Chief Complaint  Patient presents with  . Facial Swelling     HPI Patient presents to the emergency department complaining of right facial swelling and night.  Denies trauma or injury.  Reports pain to tooth #4.  He knows he has a cavity there but has not seen a dentist.  No fevers or chills.  No difficulty breathing or swallowing.  Reports right-sided facial swelling.  Pain is mild to moderate in severity.  Nothing worsens or improves his pain   Past Medical History  Diagnosis Date  . Asthma    Past Surgical History  Procedure Laterality Date  . Hernia repair     History reviewed. No pertinent family history. History  Substance Use Topics  . Smoking status: Current Every Day Smoker -- 0.50 packs/day    Types: Cigarettes  . Smokeless tobacco: Not on file  . Alcohol Use: Yes     Comment: occasionally    Review of Systems  All other systems reviewed and are negative.     Allergies  Review of patient's allergies indicates no known allergies.  Home Medications   Prior to Admission medications   Medication Sig Start Date End Date Taking? Authorizing Provider  ibuprofen (ADVIL,MOTRIN) 600 MG tablet Take 1 tablet (600 mg total) by mouth every 8 (eight) hours as needed. 09/05/14   Lyanne CoKevin M Tameisha Covell, MD  naproxen (NAPROSYN) 500 MG tablet Take 1 tablet (500 mg total) by mouth 2 (two) times daily with a meal. 04/24/13   Tammy L. Triplett, PA-C  penicillin v potassium (VEETID) 500 MG tablet Take 1 tablet (500 mg total) by mouth 3 (three) times daily. 09/05/14 09/12/14  Lyanne CoKevin M Latreece Mochizuki, MD  sulfamethoxazole-trimethoprim (SEPTRA DS) 800-160 MG per tablet Take 1 tablet by mouth 2 (two) times daily. 04/24/13   Tammy L. Triplett, PA-C   There were no vitals taken for this visit. Physical Exam  Constitutional: He is oriented to person, place, and time. He appears  well-developed and well-nourished.  HENT:  Mild right-sided facial swelling around his right maxillary sinus.  Obvious cavity with focal tenderness of tooth #4.  No gingival swelling or fluctuance.  Tolerating secretions.  Oral airway patent.  Eyes: EOM are normal.  Neck: Normal range of motion.  Pulmonary/Chest: Effort normal.  Abdominal: He exhibits no distension.  Musculoskeletal: Normal range of motion.  Neurological: He is alert and oriented to person, place, and time.  Psychiatric: He has a normal mood and affect.  Nursing note and vitals reviewed.   ED Course  Procedures (including critical care time) Labs Review Labs Reviewed - No data to display  Imaging Review No results found.   EKG Interpretation None      MDM   Final diagnoses:  Dental infection    Dental Pain. Home with antibiotics and pain medicine. Recommend dental follow up. No signs of gingival abscess. Tolerating secretions. Airway patent. No sub lingular swelling     Lyanne CoKevin M Deaundra Kutzer, MD 09/05/14 0400

## 2014-09-05 NOTE — ED Notes (Signed)
Pt c/o facial swelling to right upper tooth; pt states the pain woke him from sleep; pt has significant swelling to cheek

## 2014-09-05 NOTE — Discharge Instructions (Signed)
Dental Care and Dentist Visits  Dental care supports good overall health. Regular dental visits can also help you avoid dental pain, bleeding, infection, and other more serious health problems in the future. It is important to keep the mouth healthy because diseases in the teeth, gums, and other oral tissues can spread to other areas of the body. Some problems, such as diabetes, heart disease, and pre-term labor have been associated with poor oral health.   See your dentist every 6 months. If you experience emergency problems such as a toothache or broken tooth, go to the dentist right away. If you see your dentist regularly, you may catch problems early. It is easier to be treated for problems in the early stages.   WHAT TO EXPECT AT A DENTIST VISIT   Your dentist will look for many common oral health problems and recommend proper treatment. At your regular dental visit, you can expect:  · Gentle cleaning of the teeth and gums. This includes scraping and polishing. This helps to remove the sticky substance around the teeth and gums (plaque). Plaque forms in the mouth shortly after eating. Over time, plaque hardens on the teeth as tartar. If tartar is not removed regularly, it can cause problems. Cleaning also helps remove stains.  · Periodic X-rays. These pictures of the teeth and supporting bone will help your dentist assess the health of your teeth.  · Periodic fluoride treatments. Fluoride is a natural mineral shown to help strengthen teeth. Fluoride treatment involves applying a fluoride gel or varnish to the teeth. It is most commonly done in children.  · Examination of the mouth, tongue, jaws, teeth, and gums to look for any oral health problems, such as:  ¨ Cavities (dental caries). This is decay on the tooth caused by plaque, sugar, and acid in the mouth. It is best to catch a cavity when it is small.  ¨ Inflammation of the gums caused by plaque buildup (gingivitis).  ¨ Problems with the mouth or malformed  or misaligned teeth.  ¨ Oral cancer or other diseases of the soft tissues or jaws.   KEEP YOUR TEETH AND GUMS HEALTHY  For healthy teeth and gums, follow these general guidelines as well as your dentist's specific advice:  · Have your teeth professionally cleaned at the dentist every 6 months.  · Brush twice daily with a fluoride toothpaste.  · Floss your teeth daily.   · Ask your dentist if you need fluoride supplements, treatments, or fluoride toothpaste.  · Eat a healthy diet. Reduce foods and drinks with added sugar.  · Avoid smoking.  TREATMENT FOR ORAL HEALTH PROBLEMS  If you have oral health problems, treatment varies depending on the conditions present in your teeth and gums.  · Your caregiver will most likely recommend good oral hygiene at each visit.  · For cavities, gingivitis, or other oral health disease, your caregiver will perform a procedure to treat the problem. This is typically done at a separate appointment. Sometimes your caregiver will refer you to another dental specialist for specific tooth problems or for surgery.  SEEK IMMEDIATE DENTAL CARE IF:  · You have pain, bleeding, or soreness in the gum, tooth, jaw, or mouth area.  · A permanent tooth becomes loose or separated from the gum socket.  · You experience a blow or injury to the mouth or jaw area.  Document Released: 04/23/2011 Document Revised: 11/03/2011 Document Reviewed: 04/23/2011  [employer]® Patient Information ©2015 [employer]. This information is not intended to replace advice   given to you by your health care provider. Make sure you discuss any questions you have with your health care provider.

## 2019-12-06 ENCOUNTER — Emergency Department (HOSPITAL_COMMUNITY)
Admission: EM | Admit: 2019-12-06 | Discharge: 2019-12-06 | Disposition: A | Discharge status: Home / Self Care | Payer: Self-pay | Attending: Emergency Medicine | Admitting: Emergency Medicine

## 2019-12-06 DIAGNOSIS — J039 Acute tonsillitis, unspecified: Secondary | ICD-10-CM | POA: Insufficient documentation

## 2019-12-06 MED ORDER — DEXAMETHASONE SODIUM PHOSPHATE 10 MG/ML IJ SOLN
10.0000 mg | Freq: Once | INTRAMUSCULAR | Status: AC
  Administered 2019-12-06: 10 mg via INTRAMUSCULAR
  Filled 2019-12-06: qty 1

## 2019-12-06 MED ORDER — IBUPROFEN 100 MG/5ML PO SUSP
800.0000 mg | Freq: Once | ORAL | Status: AC
  Administered 2019-12-06: 11:00:00 800 mg via ORAL
  Filled 2019-12-06: qty 40

## 2019-12-06 MED ORDER — CLINDAMYCIN HCL 300 MG PO CAPS: 300.0000 mg | ORAL_CAPSULE | Freq: Three times a day (TID) | ORAL | 0 refills | Status: AC

## 2019-12-06 MED ORDER — CLINDAMYCIN HCL 150 MG PO CAPS
600.0000 mg | ORAL_CAPSULE | Freq: Once | ORAL | Status: AC
  Administered 2019-12-06: 600 mg via ORAL
  Filled 2019-12-06: qty 4

## 2019-12-06 NOTE — ED Provider Notes (Signed)
[facility] EMERGENCY DEPARTMENT Provider Note   CSN: [id number] Arrival date & time: 12/06/19  0915     History No chief complaint on file.   Chris Glover is a 39 y.o. male.  39 year old male presents with complaint of sore throat with painful and difficulty swallowing, more so on the right side.  Patient first noticed right-sided throat discomfort last night after eating, woke up this morning and was unable to swallow water secondary to throat pain.  Denies fevers, chills, cough, congestion, sick contacts.  No other complaints or concerns.        Past Medical History:  Diagnosis Date  . Asthma     There are no problems to display for this patient.   Past Surgical History:  Procedure Laterality Date  . HERNIA REPAIR         No family history on file.  Social History   Tobacco Use  . Smoking status: Current Every Day Smoker    Packs/day: 0.50    Types: Cigarettes  Substance Use Topics  . Alcohol use: Yes    Comment: occasionally  . Drug use: No    Home Medications Prior to Admission medications   Medication Sig Start Date End Date Taking? Authorizing Provider  clindamycin (CLEOCIN) 300 MG capsule Take 1 capsule (300 mg total) by mouth 3 (three) times daily for 10 days. 12/06/19 12/16/19  Jeannie Fend, PA-C  ibuprofen (ADVIL,MOTRIN) 600 MG tablet Take 1 tablet (600 mg total) by mouth every 8 (eight) hours as needed. 09/05/14   Azalia Bilis, MD  naproxen (NAPROSYN) 500 MG tablet Take 1 tablet (500 mg total) by mouth 2 (two) times daily with a meal. 04/24/13   Triplett, Tammy, PA-C  sulfamethoxazole-trimethoprim (SEPTRA DS) 800-160 MG per tablet Take 1 tablet by mouth 2 (two) times daily. 04/24/13   Triplett, Babette Relic, PA-C    Allergies    Patient has no known allergies.  Review of Systems   Review of Systems  Constitutional: Negative for chills and fever.  HENT: Positive for sore throat. Negative for congestion, sinus pressure, sinus  pain and sneezing.   Respiratory: Negative for cough.   Gastrointestinal: Negative for nausea and vomiting.  Musculoskeletal: Negative for neck pain and neck stiffness.  Skin: Negative for rash and wound.  Allergic/Immunologic: Negative for immunocompromised state.  Neurological: Negative for headaches.  Hematological: Positive for adenopathy.  All other systems reviewed and are negative.   Physical Exam Updated Vital Signs BP 133/80 (BP Location: Right Arm)   Pulse 71   Temp 98.2 F (36.8 C)   Resp 17   Ht 6\' 1"  (1.854 m)   Wt 100.2 kg   SpO2 95%   BMI 29.16 kg/m   Physical Exam Vitals and nursing note reviewed.  Constitutional:      General: He is not in acute distress.    Appearance: He is well-developed. He is not diaphoretic.  HENT:     Head: Normocephalic and atraumatic.     Jaw: No trismus.     Nose: Nose normal.     Mouth/Throat:     Mouth: Mucous membranes are moist.     Pharynx: Uvula midline. Oropharyngeal exudate and posterior oropharyngeal erythema present. No uvula swelling.     Tonsils: 2+ on the right. 1+ on the left.     Comments: Tonsils enlarged, right greater than left, no swelling of the soft palate, uvula midline, do not suspect abscess.  Light exudate noted to both tonsils.  Also with tender anterior cervical of adenopathy. Eyes:     Conjunctiva/sclera: Conjunctivae normal.  Pulmonary:     Effort: Pulmonary effort is normal.  Musculoskeletal:     Cervical back: Normal range of motion and neck supple. Tenderness present.  Lymphadenopathy:     Cervical: Cervical adenopathy present.  Skin:    General: Skin is warm and dry.     Findings: No erythema or rash.  Neurological:     Mental Status: He is alert and oriented to person, place, and time.  Psychiatric:        Behavior: Behavior normal.     ED Results / Procedures / Treatments   Labs (all labs ordered are listed, but only abnormal results are displayed) Labs Reviewed - No data to  display  EKG None  Radiology No results found.  Procedures Procedures (including critical care time)  Medications Ordered in ED Medications  ibuprofen (ADVIL) 100 MG/5ML suspension 800 mg (has no administration in time range)  dexamethasone (DECADRON) injection 10 mg (10 mg Intramuscular Given 12/06/19 1004)  clindamycin (CLEOCIN) capsule 600 mg (600 mg Oral Given 12/06/19 1004)    ED Course  I have reviewed the triage vital signs and the nursing notes.  Pertinent labs & imaging results that were available during my care of the patient were reviewed by me and considered in my medical decision making (see chart for details).  Clinical Course as of Dec 06 1002  Tue Dec 06, 3746  5856 39 year old male with complaint of sore throat, worse on the right, onset last night.  Found to have enlarged tonsils with tender anterior cervical of adenopathy, do not suspect a tonsillar abscess at this time.  Patient be treated with Decadron while in the ER, given first dose of clindamycin with ibuprofen and discharged with prescription for clindamycin.  Patient to return to ER for worsening or concerning symptoms otherwise follow-up with ENT if not improving. Patient is tolerating secretions, is able to speak in complete sentences.  No voice change.   [LM]    Clinical Course User Index [LM] Roque Lias   MDM Rules/Calculators/A&P                      Final Clinical Impression(s) / ED Diagnoses Final diagnoses:  Tonsillitis    Rx / DC Orders ED Discharge Orders         Ordered    clindamycin (CLEOCIN) 300 MG capsule  3 times daily     12/06/19 0954           Tacy Learn, PA-C 12/06/19 [place], [place], DO 12/06/19 1101

## 2019-12-06 NOTE — ED Triage Notes (Signed)
Pt here with c/o sore throat  Back of throat is red and swollen

## 2019-12-06 NOTE — Discharge Instructions (Addendum)
Use children's liquid Motrin and Tylenol as needed as directed for pain. Take clindamycin as prescribed and complete the full course.  If you are unable to swallow the capsules, you may open the capsules and mix with a small bite of applesauce or pudding. Return to the ER for worsening or concerning symptoms.  If symptoms or not improving, you should follow-up with the ear nose and throat doctors, referral given today. It is important to hydrate with water or Gatorade as much as possible today.  Aim for drinking about 2-3 liters of fluid.

## 2020-05-14 ENCOUNTER — Ambulatory Visit (HOSPITAL_COMMUNITY)
Admission: EM | Admit: 2020-05-14 | Discharge: 2020-05-14 | Disposition: A | Discharge status: Home / Self Care | Payer: 59 | Attending: Internal Medicine | Admitting: Internal Medicine

## 2020-05-14 ENCOUNTER — Other Ambulatory Visit: Payer: Self-pay

## 2020-05-14 ENCOUNTER — Encounter (HOSPITAL_COMMUNITY): Payer: Self-pay | Admitting: Emergency Medicine

## 2020-05-14 DIAGNOSIS — K439 Ventral hernia without obstruction or gangrene: Secondary | ICD-10-CM | POA: Diagnosis not present

## 2020-05-14 MED ORDER — IBUPROFEN 600 MG PO TABS: 600.0000 mg | ORAL_TABLET | Freq: Four times a day (QID) | ORAL | 0 refills | Status: DC | PRN

## 2020-05-14 NOTE — ED Provider Notes (Signed)
[facility]    CSN: [id number] Arrival date & time: 05/14/20  1208      History   Chief Complaint Chief Complaint  Patient presents with  . Abdominal Pain    HPI Chris Glover is a 39 y.o. male comes to the urgent care with complaints of intermittent anterior abdominal wall pain.  Patient states that he has a history of inguinal hernias.  He has had bilateral inguinal herniorrhaphy done in the past.  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  Yesterday the patient said he started experiencing pain in some swelling over epigastric hernia site.  No nausea or vomiting.  No distention of the abdomen.  No pain currently.  HPI  Past Medical History:  Diagnosis Date  . Asthma     There are no problems to display for this patient.   Past Surgical History:  Procedure Laterality Date  . HERNIA REPAIR         Home Medications    Prior to Admission medications   Medication Sig Start Date End Date Taking? Authorizing Provider  ibuprofen (ADVIL) 600 MG tablet Take 1 tablet (600 mg total) by mouth every 6 (six) hours as needed. 05/14/20   Ashdon Gillson, Britta Mccreedy, MD    Family History History reviewed. No pertinent family history.  Social History Social History   Tobacco Use  . Smoking status: Current Every Day Smoker    Packs/day: 0.50    Types: Cigarettes  Substance Use Topics  . Alcohol use: Yes    Comment: occasionally  . Drug use: No     Allergies   Patient has no known allergies.   Review of Systems Review of Systems  HENT: Negative.   Respiratory: Negative.   Gastrointestinal: Positive for abdominal pain. Negative for diarrhea, nausea and vomiting.     Physical Exam Triage Vital Signs ED Triage Vitals  Enc Vitals Group     BP 05/14/20 1427 (!) 147/76     Pulse Rate 05/14/20 1427 64     Resp 05/14/20 1427 16     Temp 05/14/20 1427 98.2 F (36.8 C)     Temp Source 05/14/20 1427 Oral      SpO2 05/14/20 1427 96 %     Weight --      Height --      Head Circumference --      Peak Flow --      Pain Score 05/14/20 1426 8     Pain Loc --      Pain Edu? --      Excl. in GC? --    No data found.  Updated Vital Signs BP (!) 147/76 (BP Location: Left Arm)   Pulse 64   Temp 98.2 F (36.8 C) (Oral)   Resp 16   SpO2 96%   Visual Acuity Right Eye Distance:   Left Eye Distance:   Bilateral Distance:    Right Eye Near:   Left Eye Near:    Bilateral Near:     Physical Exam Vitals and nursing note reviewed.  Constitutional:      General: He is not in acute distress.    Appearance: He is not ill-appearing.  Cardiovascular:     Rate and Rhythm: Normal rate and regular rhythm.  Abdominal:     General: Abdomen is flat.     Palpations: Abdomen is soft.     Tenderness: There is no abdominal  tenderness.     Hernia: A hernia is present. Hernia is present in the ventral area.     Comments: Reducible epigastric hernia.  Divarication of rectus abdomini  muscle  Neurological:     Mental Status: He is alert.      UC Treatments / Results  Labs (all labs ordered are listed, but only abnormal results are displayed) Labs Reviewed - No data to display  EKG   Radiology No results found.  Procedures Procedures (including critical care time)  Medications Ordered in UC Medications - No data to display  Initial Impression / Assessment and Plan / UC Course  I have reviewed the triage vital signs and the nursing notes.  Pertinent labs & imaging results that were available during my care of the patient were reviewed by me and considered in my medical decision making (see chart for details).     1.  Epigastric hernia: Ibuprofen as needed for pain General surgery referral Return precautions given. Final Clinical Impressions(s) / UC Diagnoses   Final diagnoses:  Epigastric hernia   Discharge Instructions   None    ED Prescriptions    Medication Sig Dispense  Auth. Provider   ibuprofen (ADVIL) 600 MG tablet Take 1 tablet (600 mg total) by mouth every 6 (six) hours as needed. 30 tablet Satsuki Zillmer, Britta Mccreedy, MD     PDMP not reviewed this encounter.   Merrilee Jansky, MD 05/14/20 [phone]

## 2020-05-14 NOTE — ED Triage Notes (Signed)
Pt presents with abdominal pain. States has hx of hernias.

## 2023-05-01 ENCOUNTER — Emergency Department (HOSPITAL_COMMUNITY): Payer: 59

## 2023-05-01 ENCOUNTER — Other Ambulatory Visit: Payer: Self-pay

## 2023-05-01 ENCOUNTER — Emergency Department (HOSPITAL_COMMUNITY)
Admission: EM | Admit: 2023-05-01 | Discharge: 2023-05-01 | Disposition: A | Discharge status: Home / Self Care | Payer: 59 | Attending: Emergency Medicine | Admitting: Emergency Medicine

## 2023-05-01 ENCOUNTER — Encounter (HOSPITAL_COMMUNITY): Payer: Self-pay

## 2023-05-01 DIAGNOSIS — R102 Pelvic and perineal pain: Secondary | ICD-10-CM | POA: Diagnosis not present

## 2023-05-01 DIAGNOSIS — R1033 Periumbilical pain: Secondary | ICD-10-CM | POA: Insufficient documentation

## 2023-05-01 DIAGNOSIS — R1084 Generalized abdominal pain: Secondary | ICD-10-CM

## 2023-05-01 DIAGNOSIS — R1030 Lower abdominal pain, unspecified: Secondary | ICD-10-CM | POA: Diagnosis not present

## 2023-05-01 DIAGNOSIS — K760 Fatty (change of) liver, not elsewhere classified: Secondary | ICD-10-CM | POA: Diagnosis not present

## 2023-05-01 DIAGNOSIS — G8929 Other chronic pain: Secondary | ICD-10-CM | POA: Diagnosis not present

## 2023-05-01 LAB — COMPREHENSIVE METABOLIC PANEL
ALT: 37 U/L (ref 0–44)
AST: 29 U/L (ref 15–41)
Albumin: 3.9 g/dL (ref 3.5–5.0)
Alkaline Phosphatase: 57 U/L (ref 38–126)
Anion gap: 8 (ref 5–15)
BUN: 14 mg/dL (ref 6–20)
CO2: 24 mmol/L (ref 22–32)
Calcium: 8.5 mg/dL — ABNORMAL LOW (ref 8.9–10.3)
Chloride: 104 mmol/L (ref 98–111)
Creatinine, Ser: 1.01 mg/dL (ref 0.61–1.24)
GFR, Estimated: >60 mL/min (ref >60)
Glucose, Bld: 115 mg/dL — ABNORMAL HIGH (ref 70–99)
Potassium: 3.8 mmol/L (ref 3.5–5.1)
Sodium: 136 mmol/L (ref 135–145)
Total Bilirubin: 0.4 mg/dL (ref 0.3–1.2)
Total Protein: 7.3 g/dL (ref 6.5–8.1)

## 2023-05-01 LAB — URINALYSIS, ROUTINE W REFLEX MICROSCOPIC
Bilirubin Urine: NEGATIVE
Glucose, UA: NEGATIVE mg/dL
Hgb urine dipstick: NEGATIVE
Ketones, ur: NEGATIVE mg/dL
Leukocytes,Ua: NEGATIVE
Nitrite: NEGATIVE
Protein, ur: NEGATIVE mg/dL
Specific Gravity, Urine: >1.046 — ABNORMAL HIGH (ref 1.005–1.030)
pH: 5 (ref 5.0–8.0)

## 2023-05-01 LAB — CBC WITH DIFFERENTIAL/PLATELET
Abs Immature Granulocytes: 0.02 10*3/uL (ref 0.00–0.07)
Basophils Absolute: 0 10*3/uL (ref 0.0–0.1)
Basophils Relative: 1 %
Eosinophils Absolute: 0.1 10*3/uL (ref 0.0–0.5)
Eosinophils Relative: 3 %
HCT: 46.5 % (ref 39.0–52.0)
Hemoglobin: 15.1 g/dL (ref 13.0–17.0)
Immature Granulocytes: 1 %
Lymphocytes Relative: 34 %
Lymphs Abs: 1.2 10*3/uL (ref 0.7–4.0)
MCH: 27.2 pg (ref 26.0–34.0)
MCHC: 32.5 g/dL (ref 30.0–36.0)
MCV: 83.6 fL (ref 80.0–100.0)
Monocytes Absolute: 0.3 10*3/uL (ref 0.1–1.0)
Monocytes Relative: 8 %
Neutro Abs: 1.8 10*3/uL (ref 1.7–7.7)
Neutrophils Relative %: 53 %
Platelets: 202 10*3/uL (ref 150–400)
RBC: 5.56 MIL/uL (ref 4.22–5.81)
RDW: 14.9 % (ref 11.5–15.5)
WBC: 3.4 10*3/uL — ABNORMAL LOW (ref 4.0–10.5)
nRBC: 0 % (ref 0.0–0.2)

## 2023-05-01 LAB — LIPASE, BLOOD: Lipase: 28 U/L (ref 11–51)

## 2023-05-01 MED ORDER — SODIUM CHLORIDE (PF) 0.9 % IJ SOLN
INTRAMUSCULAR | Status: AC
  Filled 2023-05-01: qty 50

## 2023-05-01 MED ORDER — MORPHINE SULFATE (PF) 4 MG/ML IV SOLN
4.0000 mg | Freq: Once | INTRAVENOUS | Status: AC
  Administered 2023-05-01: 4 mg via INTRAVENOUS
  Filled 2023-05-01: qty 1

## 2023-05-01 MED ORDER — IOHEXOL 300 MG/ML  SOLN
100.0000 mL | Freq: Once | INTRAMUSCULAR | Status: AC | PRN
  Administered 2023-05-01: 100 mL via INTRAVENOUS

## 2023-05-01 MED ORDER — LACTATED RINGERS IV BOLUS
1000.0000 mL | Freq: Once | INTRAVENOUS | Status: AC
  Administered 2023-05-01: 1000 mL via INTRAVENOUS

## 2023-05-01 NOTE — Discharge Instructions (Addendum)
Your CT scan and lab work were reassuring.  I recommend you follow-up with a primary care physician.  If you develop any new or concerning symptoms you should return to the ED.

## 2023-05-01 NOTE — ED Triage Notes (Signed)
Patient BIB EMS for sudden onset of groin/pelvic pain x 2 hours. Patient denies nausea, vomiting, diarrhea, and fevers.  VSS, NAD.

## 2023-05-01 NOTE — ED Provider Notes (Signed)
[place] EMERGENCY DEPARTMENT AT [facility] Provider Note   CSN: [id number] Arrival date & time: 05/01/23  7829     History  Chief Complaint  Patient presents with   Pelvic Pain    Chris Glover is a 42 y.o. male.   Pelvic Pain  42 year old male with history of prior abdominal wall hernia presenting for abdominal pain.  Short about 2 hours ago while at work.  Pain is around his umbilicus, severe, intermittent.  No specific exacerbating factors.  No nausea or vomiting or diarrhea.  Regular bowel movements.  No groin pain or testicular pain or swelling.  No dysuria or hematuria.  No fevers or chills.  No chest pain, shortness of breath.     Home Medications Prior to Admission medications   Medication Sig Start Date End Date Taking? Authorizing Provider  ibuprofen (ADVIL) 600 MG tablet Take 1 tablet (600 mg total) by mouth every 6 (six) hours as needed. 05/14/20   Lamptey, Britta Mccreedy, MD      Allergies    Patient has no known allergies.    Review of Systems   Review of Systems  Genitourinary:  Positive for pelvic pain.  Review of systems completed and notable as per HPI.  ROS otherwise negative.   Physical Exam Updated Vital Signs BP (!) 142/102   Pulse 66   Temp 97.7 F (36.5 C) (Oral)   Resp 17   Ht 6\' 1"  (1.854 m)   Wt 97.5 kg   SpO2 100%   BMI 28.37 kg/m  Physical Exam Vitals and nursing note reviewed.  Constitutional:      General: He is not in acute distress.    Appearance: He is well-developed.  HENT:     Head: Normocephalic and atraumatic.  Eyes:     Conjunctiva/sclera: Conjunctivae normal.  Cardiovascular:     Rate and Rhythm: Normal rate and regular rhythm.     Heart sounds: No murmur heard. Pulmonary:     Effort: Pulmonary effort is normal. No respiratory distress.     Breath sounds: Normal breath sounds.  Abdominal:     Palpations: Abdomen is soft.     Tenderness: There is abdominal tenderness. There is no guarding or rebound.      Comments: Mild tenderness periumbilical, no palpable hernia.  Musculoskeletal:        General: No swelling.     Cervical back: Neck supple.     Right lower leg: No edema.     Left lower leg: No edema.  Skin:    General: Skin is warm and dry.     Capillary Refill: Capillary refill takes less than 2 seconds.  Neurological:     Mental Status: He is alert.  Psychiatric:        Mood and Affect: Mood normal.     ED Results / Procedures / Treatments   Labs (all labs ordered are listed, but only abnormal results are displayed) Labs Reviewed  COMPREHENSIVE METABOLIC PANEL - Abnormal; Notable for the following components:      Result Value   Glucose, Bld 115 (*)    Calcium 8.5 (*)    All other components within normal limits  CBC WITH DIFFERENTIAL/PLATELET - Abnormal; Notable for the following components:   WBC 3.4 (*)    All other components within normal limits  URINALYSIS, ROUTINE W REFLEX MICROSCOPIC - Abnormal; Notable for the following components:   Color, Urine STRAW (*)    Specific Gravity, Urine >1.046 (*)  All other components within normal limits  LIPASE, BLOOD    EKG None  Radiology CT ABDOMEN PELVIS W CONTRAST  Result Date: 05/01/2023 CLINICAL DATA:  Lower abdominal pain that is now moving over the last few days. EXAM: CT ABDOMEN AND PELVIS WITH CONTRAST TECHNIQUE: Multidetector CT imaging of the abdomen and pelvis was performed using the standard protocol following bolus administration of intravenous contrast. RADIATION DOSE REDUCTION: This exam was performed according to the departmental dose-optimization program which includes automated exposure control, adjustment of the mA and/or kV according to patient size and/or use of iterative reconstruction technique. CONTRAST:  OMNIPAQUE IOHEXOL 300 MG/ML  SOLN COMPARISON:  None Available. FINDINGS: Lower chest: Minimal ground-glass along bases, nonspecific. No pleural effusion. Few tiny subpleural blebs along the  middle lobe anteriorly. Small middle lobe air cysts as well. Hepatobiliary: Diffuse fatty liver infiltration. No space-occupying liver lesion. Gallbladder is nondilated. Patent portal vein. Pancreas: Unremarkable. No pancreatic ductal dilatation or surrounding inflammatory changes. Spleen: Normal in size without focal abnormality. Adrenals/Urinary Tract: Adrenal glands are unremarkable. Kidneys are normal, without renal calculi, focal lesion, or hydronephrosis. Bladder is unremarkable. Stomach/Bowel: On this non oral contrast exam, the large bowel is of normal course and caliber with scattered stool. There is fluid in the stomach. Small bowel is nondilated. Normal appendix. Vascular/Lymphatic: Aortic atherosclerosis. No enlarged abdominal or pelvic lymph nodes. Reproductive: Prostate is unremarkable. Other: Small fat containing inguinal hernias. Question surgical changes in the umbilicus. No free air or free fluid. Musculoskeletal: Metallic focus seen overlying the medial right thigh soft tissues the margin of the musculature. Please correlate for a radiopaque foreign body as seen on coronal series 8, image 72, axial series 2, image 102. Scattered degenerative changes along the spine and pelvis. Bridging osteophytes along the left sacroiliac joint. IMPRESSION: No bowel obstruction, free air or free fluid. Scattered stool. Normal appendix. Fatty liver infiltration. Radiopaque foreign body in the medial aspect of the right thigh along the margin of the adductor muscles. Please correlate for known history. Electronically Signed   By: Karen Kays M.D.   On: 05/01/2023 11:53    Procedures Procedures    Medications Ordered in ED Medications  lactated ringers bolus 1,000 mL (0 mLs Intravenous Stopped 05/01/23 1315)  morphine (PF) 4 MG/ML injection 4 mg (4 mg Intravenous Given 05/01/23 0949)  iohexol (OMNIPAQUE) 300 MG/ML solution 100 mL (100 mLs Intravenous Contrast Given 05/01/23 1051)    ED Course/ Medical  Decision Making/ A&P                                  Medical Decision Making Amount and/or Complexity of Data Reviewed Labs: ordered. Radiology: ordered.  Risk Prescription drug management.   Medical Decision Making:   Chris Glover is a 41 y.o. male who presented to the ED today with 2 hours of periumbilical abdominal pain for vital signs reviewed.  Exam patient has mild tenderness of the abdominal wall.  He has history of prior hernia although I cannot palpate 1 here.  Will obtain CT scan to evaluate for hernia, appendicitis, cholecystitis, obstruction, pancreatitis.   Patient placed on continuous vitals and telemetry monitoring while in ED which was reviewed periodically.  Reviewed and confirmed nursing documentation for past medical history, family history, social history.  Reassessment and Plan:   CT scan reassuring.  On reassessment he is asymptomatic.  Tolerating p.o.  Unclear cause of symptoms, consider possible  muscle cramping.  Workup appears reassuring.  Recommend PCP follow-up.  Return precautions given.    Patient's presentation is most consistent with acute complicated illness / injury requiring diagnostic workup.           Final Clinical Impression(s) / ED Diagnoses Final diagnoses:  Generalized abdominal pain    Rx / DC Orders ED Discharge Orders     None         Laurence Spates, MD 05/01/23 1807

## 2023-05-18 ENCOUNTER — Emergency Department (HOSPITAL_COMMUNITY): Payer: 59

## 2023-05-18 ENCOUNTER — Other Ambulatory Visit: Payer: Self-pay

## 2023-05-18 ENCOUNTER — Emergency Department (HOSPITAL_COMMUNITY)
Admission: EM | Admit: 2023-05-18 | Discharge: 2023-05-18 | Discharge status: Against Medical Advice | Payer: 59 | Attending: Emergency Medicine | Admitting: Emergency Medicine

## 2023-05-18 ENCOUNTER — Encounter (HOSPITAL_COMMUNITY): Payer: Self-pay

## 2023-05-18 DIAGNOSIS — M25531 Pain in right wrist: Secondary | ICD-10-CM | POA: Diagnosis not present

## 2023-05-18 DIAGNOSIS — S6991XA Unspecified injury of right wrist, hand and finger(s), initial encounter: Secondary | ICD-10-CM | POA: Insufficient documentation

## 2023-05-18 DIAGNOSIS — X58XXXA Exposure to other specified factors, initial encounter: Secondary | ICD-10-CM | POA: Insufficient documentation

## 2023-05-18 DIAGNOSIS — Y9354 Activity, bowling: Secondary | ICD-10-CM | POA: Insufficient documentation

## 2023-05-18 DIAGNOSIS — Z5321 Procedure and treatment not carried out due to patient leaving prior to being seen by health care provider: Secondary | ICD-10-CM | POA: Insufficient documentation

## 2023-05-18 NOTE — ED Triage Notes (Signed)
Pt injured right wrist after bowling last Tuesday. Pt has swelling in right wrist and pain.

## 2023-06-29 ENCOUNTER — Ambulatory Visit
Admission: EM | Admit: 2023-06-29 | Discharge: 2023-06-29 | Disposition: A | Discharge status: Home / Self Care | Payer: 59 | Attending: Internal Medicine | Admitting: Internal Medicine

## 2023-06-29 ENCOUNTER — Ambulatory Visit (INDEPENDENT_AMBULATORY_CARE_PROVIDER_SITE_OTHER): Payer: 59

## 2023-06-29 DIAGNOSIS — M25531 Pain in right wrist: Secondary | ICD-10-CM | POA: Diagnosis not present

## 2023-06-29 DIAGNOSIS — M1811 Unilateral primary osteoarthritis of first carpometacarpal joint, right hand: Secondary | ICD-10-CM | POA: Diagnosis not present

## 2023-06-29 MED ORDER — PREDNISONE 20 MG PO TABS: 40.0000 mg | ORAL_TABLET | Freq: Every day | ORAL | 0 refills | Status: AC

## 2023-06-29 NOTE — ED Provider Notes (Signed)
[facility]    CSN: [id number] Arrival date & time: 06/29/23  1055      History   Chief Complaint Chief Complaint  Patient presents with   Wrist Pain    HPI Chris Glover is a 42 y.o. male.   Patient here today for evaluation of right wrist pain and swelling.  He reports that he was initially seen in the emergency department but due to wait times he left.  He states that the pain and swelling have worsened since time.  He denies any known injury.  He states symptoms started about 3 weeks ago.  The history is provided by the patient.  Wrist Pain    Past Medical History:  Diagnosis Date   Asthma     There are no problems to display for this patient.   Past Surgical History:  Procedure Laterality Date   HERNIA REPAIR         Home Medications    Prior to Admission medications   Medication Sig Start Date End Date Taking? Authorizing Provider  acetaminophen (TYLENOL) 500 MG tablet Take 500 mg by mouth every 6 (six) hours as needed for moderate pain (pain score 4-6).    [provider]  ibuprofen (ADVIL) 600 MG tablet Take 1 tablet (600 mg total) by mouth every 6 (six) hours as needed. Patient not taking: Reported on 07/08/2023 05/14/20   Merrilee Jansky, MD  naproxen (NAPROSYN) 500 MG tablet Take 1 tablet (500 mg total) by mouth 2 (two) times daily. 07/08/23   Darrick Grinder PA-C    Family History History reviewed. No pertinent family history.  Social History Social History   Tobacco Use   Smoking status: Some Days    Current packs/day: 0.50    Types: Cigarettes  Vaping Use   Vaping status: Never Used  Substance Use Topics   Alcohol use: Yes    Comment: occasionally   Drug use: No     Allergies   Patient has no known allergies.   Review of Systems Review of Systems  Constitutional:  Negative for chills and fever.  Eyes:  Negative for discharge and redness.  Musculoskeletal:  Positive for arthralgias and joint  swelling.  Skin:  Negative for color change and wound.  Neurological:  Negative for numbness.     Physical Exam Triage Vital Signs ED Triage Vitals  Encounter Vitals Group     BP 06/29/23 1120 (!) 152/82     Systolic BP Percentile --      Diastolic BP Percentile --      Pulse Rate 06/29/23 1120 78     Resp 06/29/23 1120 18     Temp 06/29/23 1120 98.2 F (36.8 C)     Temp Source 06/29/23 1120 Oral     SpO2 06/29/23 1120 96 %     Weight 06/29/23 1119 229 lb 15 oz (104.3 kg)     Height 06/29/23 1119 6\' 1"  (1.854 m)     Head Circumference --      Peak Flow --      Pain Score 06/29/23 1116 8     Pain Loc --      Pain Education --      Exclude from Growth Chart --    No data found.  Updated Vital Signs BP (!) 152/82 (BP Location: Left Arm) Comment: "in alot of pain"  Pulse 78   Temp 98.2 F (36.8 C) (Oral)   Resp 18   Ht 6'  1" (1.854 m)   Wt 229 lb 15 oz (104.3 kg)   SpO2 96%   BMI 30.34 kg/m   Visual Acuity Right Eye Distance:   Left Eye Distance:   Bilateral Distance:    Right Eye Near:   Left Eye Near:    Bilateral Near:     Physical Exam Vitals and nursing note reviewed.  Constitutional:      General: He is not in acute distress.    Appearance: Normal appearance. He is not ill-appearing.  HENT:     Head: Normocephalic and atraumatic.  Eyes:     Conjunctiva/sclera: Conjunctivae normal.  Cardiovascular:     Rate and Rhythm: Normal rate.  Pulmonary:     Effort: Pulmonary effort is normal.  Musculoskeletal:     Comments: Mild swelling noted to right wrist with decreased range of motion due to pain  Neurological:     Mental Status: He is alert.  Psychiatric:        Mood and Affect: Mood normal.        Behavior: Behavior normal.        Thought Content: Thought content normal.      UC Treatments / Results  Labs (all labs ordered are listed, but only abnormal results are displayed) Labs Reviewed - No data to display  EKG   Radiology No  results found.  Procedures Procedures (including critical care time)  Medications Ordered in UC Medications - No data to display  Initial Impression / Assessment and Plan / UC Course  I have reviewed the triage vital signs and the nursing notes.  Pertinent labs & imaging results that were available during my care of the patient were reviewed by me and considered in my medical decision making (see chart for details).    X-ray without fracture or acute findings.  Suspect likely inflammatory cause of pain.  Will treat with steroids with strict precaution to follow-up with Ortho should symptoms fail to improve or worsen.  Final Clinical Impressions(s) / UC Diagnoses   Final diagnoses:  Right wrist pain   Discharge Instructions   None    ED Prescriptions     Medication Sig Dispense Auth. Provider   predniSONE (DELTASONE) 20 MG tablet Take 2 tablets (40 mg total) by mouth daily with breakfast for 5 days. 10 tablet Tomi Bamberger, PA-C      PDMP not reviewed this encounter.   Tomi Bamberger, PA-C 07/22/23 [phone]

## 2023-06-29 NOTE — ED Triage Notes (Signed)
"  I am having right wrist pain and swelling, I went to the ED but they couldn't get to me". "Now the pain/swelling is unbearable". First noticed "about 3 wks ago, ? Injury".

## 2023-07-08 ENCOUNTER — Emergency Department (HOSPITAL_COMMUNITY)
Admission: EM | Admit: 2023-07-08 | Discharge: 2023-07-08 | Disposition: A | Discharge status: Home / Self Care | Payer: 59 | Attending: Emergency Medicine | Admitting: Emergency Medicine

## 2023-07-08 ENCOUNTER — Encounter (HOSPITAL_COMMUNITY): Payer: Self-pay

## 2023-07-08 ENCOUNTER — Other Ambulatory Visit: Payer: Self-pay

## 2023-07-08 DIAGNOSIS — R519 Headache, unspecified: Secondary | ICD-10-CM | POA: Diagnosis not present

## 2023-07-08 DIAGNOSIS — J45909 Unspecified asthma, uncomplicated: Secondary | ICD-10-CM | POA: Insufficient documentation

## 2023-07-08 DIAGNOSIS — K0889 Other specified disorders of teeth and supporting structures: Secondary | ICD-10-CM

## 2023-07-08 DIAGNOSIS — K029 Dental caries, unspecified: Secondary | ICD-10-CM | POA: Insufficient documentation

## 2023-07-08 MED ORDER — PENICILLIN V POTASSIUM 500 MG PO TABS: 500.0000 mg | ORAL_TABLET | Freq: Four times a day (QID) | ORAL | 0 refills | Status: AC

## 2023-07-08 MED ORDER — KETOROLAC TROMETHAMINE 15 MG/ML IJ SOLN
15.0000 mg | Freq: Once | INTRAMUSCULAR | Status: AC
  Administered 2023-07-08: 15 mg via INTRAMUSCULAR
  Filled 2023-07-08: qty 1

## 2023-07-08 MED ORDER — NAPROXEN 500 MG PO TABS: 500.0000 mg | ORAL_TABLET | Freq: Two times a day (BID) | ORAL | 0 refills | Status: DC

## 2023-07-08 NOTE — ED Triage Notes (Signed)
Pt states that he has pain to the right side of his mouth and face x 2 days. Pt reports having dental issues to the right upper area.

## 2023-07-08 NOTE — Discharge Instructions (Addendum)
I have prescribed Naprosyn for formation along with penicillin for antibiotic coverage.  Please schedule appointment with dentistry for definitive management.

## 2023-07-08 NOTE — ED Provider Notes (Signed)
[place] EMERGENCY DEPARTMENT AT [facility] Provider Note   CSN: [id number] Arrival date & time: 07/08/23  0320     History  Chief Complaint  Patient presents with   Dental Pain   Facial Pain    Chris Glover is a 42 y.o. male.  Patient presents to the emergency room complaining of right sided upper dental pain which is been ongoing for 2 days.  Patient has known dental issues in that area.  He denies any injury to the area, nausea, vomiting, fevers.  Past medical history significant for asthma.   Dental Pain      Home Medications Prior to Admission medications   Medication Sig Start Date End Date Taking? Authorizing Provider  acetaminophen (TYLENOL) 500 MG tablet Take 500 mg by mouth every 6 (six) hours as needed for moderate pain (pain score 4-6).   Yes [provider]  naproxen (NAPROSYN) 500 MG tablet Take 1 tablet (500 mg total) by mouth 2 (two) times daily. 07/08/23  Yes Barrie Dunker B, PA-C  penicillin v potassium (VEETID) 500 MG tablet Take 1 tablet (500 mg total) by mouth 4 (four) times daily for 10 days. 07/08/23 07/18/23 Yes Darrick Grinder, PA-C  ibuprofen (ADVIL) 600 MG tablet Take 1 tablet (600 mg total) by mouth every 6 (six) hours as needed. Patient not taking: Reported on 07/08/2023 05/14/20   Merrilee Jansky, MD      Allergies    Patient has no known allergies.    Review of Systems   Review of Systems  Physical Exam Updated Vital Signs BP (!) 172/85 (BP Location: Left Arm)   Pulse 87   Temp 97.8 F (36.6 C) (Oral)   Resp 18   Ht 6\' 1"  (1.854 m)   Wt 104.3 kg   SpO2 97%   BMI 30.34 kg/m  Physical Exam Vitals and nursing note reviewed.  HENT:     Head: Normocephalic and atraumatic.     Mouth/Throat:   Eyes:     Pupils: Pupils are equal, round, and reactive to light.  Pulmonary:     Effort: Pulmonary effort is normal. No respiratory distress.  Musculoskeletal:        General: No signs of injury.      Cervical back: Normal range of motion.  Skin:    General: Skin is dry.  Neurological:     Mental Status: He is alert.  Psychiatric:        Speech: Speech normal.        Behavior: Behavior normal.     ED Results / Procedures / Treatments   Labs (all labs ordered are listed, but only abnormal results are displayed) Labs Reviewed - No data to display  EKG None  Radiology No results found.  Procedures Procedures    Medications Ordered in ED Medications  ketorolac (TORADOL) 15 MG/ML injection 15 mg (15 mg Intramuscular Given 07/08/23 0446)    ED Course/ Medical Decision Making/ A&P                                 Medical Decision Making Risk Prescription drug management.   This patient presents to the ED for concern of dental pain, this involves an extensive number of treatment options, and is a complaint that carries with it a high risk of complications and morbidity.  The differential diagnosis includes dental abscess, infection, cavities, others   Co morbidities  that complicate the patient evaluation  Asthma   Problem List / ED Course / Critical interventions / Medication management   I ordered medication including Toradol for inflammation Reevaluation of the patient after these medicines showed that the patient improved I have reviewed the patients home medicines and have made adjustments as needed   Test / Admission - Considered:  Patient with gingival swelling but no obvious drainable abscess.  Patient able to swallow.  No tonsillar swelling to suggest PTA.  No voice changes.  No broken teeth.  Poor dentition overall.  Plan to discharge home with prescription for penicillin for possible developing dental infection and Naprosyn for inflammation.  I explained to the patient that it is very important that he follows up with dentistry for definitive management of his dental pain.  Patient voices understanding with plan.         Final Clinical  Impression(s) / ED Diagnoses Final diagnoses:  Pain, dental    Rx / DC Orders ED Discharge Orders          Ordered    naproxen (NAPROSYN) 500 MG tablet  2 times daily        07/08/23 0436    penicillin v potassium (VEETID) 500 MG tablet  4 times daily        07/08/23 0436              Darrick Grinder, PA-C 07/08/23 0453    Nira Conn, MD 07/08/23 0800

## 2023-08-22 ENCOUNTER — Ambulatory Visit (INDEPENDENT_AMBULATORY_CARE_PROVIDER_SITE_OTHER): Payer: 59

## 2023-08-22 ENCOUNTER — Encounter: Payer: Self-pay | Admitting: *Deleted

## 2023-08-22 ENCOUNTER — Other Ambulatory Visit: Payer: Self-pay

## 2023-08-22 ENCOUNTER — Ambulatory Visit
Admission: EM | Admit: 2023-08-22 | Discharge: 2023-08-22 | Disposition: A | Discharge status: Home / Self Care | Payer: 59 | Attending: Physician Assistant | Admitting: Physician Assistant

## 2023-08-22 DIAGNOSIS — M25522 Pain in left elbow: Secondary | ICD-10-CM | POA: Diagnosis not present

## 2023-08-22 DIAGNOSIS — M25422 Effusion, left elbow: Secondary | ICD-10-CM | POA: Diagnosis not present

## 2023-08-22 DIAGNOSIS — S59902A Unspecified injury of left elbow, initial encounter: Secondary | ICD-10-CM

## 2023-08-22 DIAGNOSIS — S52045A Nondisplaced fracture of coronoid process of left ulna, initial encounter for closed fracture: Secondary | ICD-10-CM

## 2023-08-22 DIAGNOSIS — W19XXXA Unspecified fall, initial encounter: Secondary | ICD-10-CM

## 2023-08-22 MED ORDER — HYDROCODONE-ACETAMINOPHEN 5-325 MG PO TABS: 0.5000 | ORAL_TABLET | Freq: Two times a day (BID) | ORAL | 0 refills | Status: AC | PRN

## 2023-08-22 MED ORDER — IBUPROFEN 600 MG PO TABS: 600.0000 mg | ORAL_TABLET | Freq: Four times a day (QID) | ORAL | 0 refills | Status: DC | PRN

## 2023-08-22 NOTE — ED Provider Notes (Signed)
[facility]    CSN: [id number] Arrival date & time: 08/22/23  9629      History   Chief Complaint Chief Complaint  Patient presents with   Fall    HPI Chris Glover is a 42 y.o. male.   Patient presents today with a 1 day history of left elbow pain.  He reports that he slipped while going out onto the deck falling onto his left elbow and has had ongoing pain since that time.  Pain is rated 8 on a 0-10 pain scale, described as throbbing, worse with certain movements of the elbow, no alleviating factors notified.  He has never injured or had any surgery on his left elbow.  Denies any numbness or paresthesias in the hands.  He has tried Tylenol without improvement.  He is right-handed.  He is having difficulty with daily duties as a result of his symptoms.    Past Medical History:  Diagnosis Date   Asthma     There are no active problems to display for this patient.   Past Surgical History:  Procedure Laterality Date   HERNIA REPAIR         Home Medications    Prior to Admission medications   Medication Sig Start Date End Date Taking? Authorizing Provider  HYDROcodone-acetaminophen (NORCO/VICODIN) 5-325 MG tablet Take 0.5-1 tablets by mouth 2 (two) times daily as needed for up to 2 days. 08/22/23 08/24/23 Yes Andru Genter, Noberto Retort, PA-C  acetaminophen (TYLENOL) 500 MG tablet Take 500 mg by mouth every 6 (six) hours as needed for moderate pain (pain score 4-6).    [provider]  ibuprofen (ADVIL) 600 MG tablet Take 1 tablet (600 mg total) by mouth every 6 (six) hours as needed. 08/22/23   Bernie Fobes, Noberto Retort, PA-C  naproxen (NAPROSYN) 500 MG tablet Take 1 tablet (500 mg total) by mouth 2 (two) times daily. 07/08/23   Darrick Grinder PA-C    Family History History reviewed. No pertinent family history.  Social History Social History   Tobacco Use   Smoking status: Some Days    Current packs/day: 0.50    Types: Cigarettes  Vaping Use   Vaping  status: Never Used  Substance Use Topics   Alcohol use: Yes    Comment: occasionally   Drug use: No     Allergies   Patient has no known allergies.   Review of Systems Review of Systems  Constitutional:  Positive for activity change. Negative for appetite change, fatigue and fever.  Musculoskeletal:  Positive for arthralgias and joint swelling. Negative for myalgias.  Skin:  Negative for color change and wound.  Neurological:  Negative for weakness and numbness.     Physical Exam Triage Vital Signs ED Triage Vitals  Encounter Vitals Group     BP 08/22/23 0915 (!) 141/102     Systolic BP Percentile --      Diastolic BP Percentile --      Pulse Rate 08/22/23 0915 63     Resp 08/22/23 0915 16     Temp 08/22/23 0915 97.8 F (36.6 C)     Temp Source 08/22/23 0915 Oral     SpO2 08/22/23 0915 98 %     Weight --      Height --      Head Circumference --      Peak Flow --      Pain Score 08/22/23 0914 8     Pain Loc --  Pain Education --      Exclude from Growth Chart --    No data found.  Updated Vital Signs BP (!) 141/102 (BP Location: Right Arm)   Pulse 63   Temp 97.8 F (36.6 C) (Oral)   Resp 16   SpO2 98%   Visual Acuity Right Eye Distance:   Left Eye Distance:   Bilateral Distance:    Right Eye Near:   Left Eye Near:    Bilateral Near:     Physical Exam Vitals reviewed.  Constitutional:      General: He is awake.     Appearance: Normal appearance. He is well-developed. He is not ill-appearing.     Comments: Very pleasant male appears stated age in no acute distress sitting comfortably in exam room  HENT:     Head: Normocephalic and atraumatic.     Mouth/Throat:     Pharynx: Uvula midline. No oropharyngeal exudate or posterior oropharyngeal erythema.  Cardiovascular:     Rate and Rhythm: Normal rate and regular rhythm.     Heart sounds: Normal heart sounds, S1 normal and S2 normal. No murmur heard.    Comments: Capillary refill within 2  seconds left fingers Pulmonary:     Effort: Pulmonary effort is normal.     Breath sounds: Normal breath sounds. No stridor. No wheezing, rhonchi or rales.     Comments: Clear to auscultation bilaterally Musculoskeletal:     Left elbow: Swelling present. No lacerations. Decreased range of motion. Tenderness present in medial epicondyle.     Comments: Left elbow: Significant tenderness palpation and tenderness over medial elbow.  Decreased range of motion with pronation secondary to pain.  Hand is neurovascularly intact.  No deformity on exam.  Neurological:     Mental Status: He is alert.  Psychiatric:        Behavior: Behavior is cooperative.      UC Treatments / Results  Labs (all labs ordered are listed, but only abnormal results are displayed) Labs Reviewed - No data to display  EKG   Radiology DG ELBOW COMPLETE LEFT (3+VIEW) Result Date: 08/22/2023 CLINICAL DATA:  Fall, elbow pain EXAM: LEFT ELBOW - COMPLETE 3+ VIEW COMPARISON:  None Available. FINDINGS: 2 mm bony density projecting adjacent to the coronoid process seen only on lateral view which could reflect a small intra-articular loose body or possibly a tiny fracture fragment. There is a small joint effusion. Elsewhere, no evidence of fracture with attention to the radial head and neck. Normal alignment. No focal soft tissue swelling. IMPRESSION: 1. 2 mm bony density projecting adjacent to the coronoid process seen only on lateral view which could reflect a small intra-articular loose body or possibly a tiny fracture fragment. 2. Small joint effusion. Electronically Signed   By: Duanne Guess D.O.   On: 08/22/2023 10:13    Procedures Procedures (including critical care time)  Medications Ordered in UC Medications - No data to display  Initial Impression / Assessment and Plan / UC Course  I have reviewed the triage vital signs and the nursing notes.  Pertinent labs & imaging results that were available during my  care of the patient were reviewed by me and considered in my medical decision making (see chart for details).     Patient is well-appearing, afebrile, nontoxic, nontachycardic.  X-ray was obtained that showed possible coronoid process fracture.  Given mechanism of injury and radiographic abnormalities he was placed in posterior long-arm splint and encouraged to follow-up with orthopedics within  48 hours.  He was given the contact information for [facility] who is on-call as well as information for their walk-in clinic with instruction to be seen today if possible.  He was given ibuprofen for pain relief as well as 4 tablet of hydrocodone.  We discussed that hydrocodone is addictive and sedating he should limit use is much as possible.  Review of [place] controlled substance database shows no inappropriate refills.  Patient expressed understanding.  We discussed the importance of following up with orthopedics to ensure appropriate healing and for further evaluation and management to which she expressed understanding.  Strict return precautions given.  Work excuse note provided.  Final Clinical Impressions(s) / UC Diagnoses   Final diagnoses:  Closed nondisplaced fracture of coronoid process of left ulna, initial encounter  Elbow injury, left, initial encounter  Fall, initial encounter     Discharge Instructions      We are concerned that there is a small piece of bone that has broken off when you fell.  Please follow-up with [facility] as soon as possible (ideally today during their walk-in hours between 10 AM and 3 PM).  Use ibuprofen for pain relief.  Do not take NSAIDs with this medication including aspirin, ibuprofen/Advil, naproxen/Aleve.  I have called in a few doses of hydrocodone.  This is addictive and sedating so should be used as infrequently as possible.  If anything worsens or changes please go to the emergency room including swelling of your hand, numbness or tingling,  discoloration, increasing pain.     ED Prescriptions     Medication Sig Dispense Auth. Provider   ibuprofen (ADVIL) 600 MG tablet Take 1 tablet (600 mg total) by mouth every 6 (six) hours as needed. 30 tablet Hardin Hardenbrook K, PA-C   HYDROcodone-acetaminophen (NORCO/VICODIN) 5-325 MG tablet Take 0.5-1 tablets by mouth 2 (two) times daily as needed for up to 2 days. 4 tablet Taiden Raybourn K, PA-C      I have reviewed the PDMP during this encounter.   Jeani Hawking, PA-C 08/22/23 1044

## 2023-08-22 NOTE — Discharge Instructions (Signed)
We are concerned that there is a small piece of bone that has broken off when you fell.  Please follow-up with [facility] as soon as possible (ideally today during their walk-in hours between 10 AM and 3 PM).  Use ibuprofen for pain relief.  Do not take NSAIDs with this medication including aspirin, ibuprofen/Advil, naproxen/Aleve.  I have called in a few doses of hydrocodone.  This is addictive and sedating so should be used as infrequently as possible.  If anything worsens or changes please go to the emergency room including swelling of your hand, numbness or tingling, discoloration, increasing pain.

## 2023-08-22 NOTE — ED Triage Notes (Signed)
Pt reports slip and fall on wooden porch yesterday and landed on left elbow. C/o pain and swelling. Able to wiggle fingers- c/o pain when rotating forearm

## 2023-08-24 DIAGNOSIS — S63501A Unspecified sprain of right wrist, initial encounter: Secondary | ICD-10-CM | POA: Diagnosis not present

## 2023-08-24 DIAGNOSIS — M25522 Pain in left elbow: Secondary | ICD-10-CM | POA: Diagnosis not present

## 2023-09-07 DIAGNOSIS — S63501A Unspecified sprain of right wrist, initial encounter: Secondary | ICD-10-CM | POA: Diagnosis not present

## 2023-09-07 DIAGNOSIS — M25522 Pain in left elbow: Secondary | ICD-10-CM | POA: Diagnosis not present

## 2023-09-24 DIAGNOSIS — M25622 Stiffness of left elbow, not elsewhere classified: Secondary | ICD-10-CM | POA: Diagnosis not present

## 2024-05-09 ENCOUNTER — Ambulatory Visit
Admission: EM | Admit: 2024-05-09 | Discharge: 2024-05-09 | Disposition: A | Discharge status: Home / Self Care | Payer: Self-pay | Attending: Family Medicine | Admitting: Family Medicine

## 2024-05-09 ENCOUNTER — Ambulatory Visit (INDEPENDENT_AMBULATORY_CARE_PROVIDER_SITE_OTHER): Payer: Self-pay

## 2024-05-09 ENCOUNTER — Other Ambulatory Visit: Payer: Self-pay

## 2024-05-09 ENCOUNTER — Encounter: Payer: Self-pay | Admitting: *Deleted

## 2024-05-09 DIAGNOSIS — M778 Other enthesopathies, not elsewhere classified: Secondary | ICD-10-CM

## 2024-05-09 DIAGNOSIS — M25532 Pain in left wrist: Secondary | ICD-10-CM

## 2024-05-09 MED ORDER — PREDNISONE 20 MG PO TABS: 40.0000 mg | ORAL_TABLET | Freq: Every day | ORAL | 0 refills | Status: AC

## 2024-05-09 MED ORDER — NAPROXEN 500 MG PO TABS: 500.0000 mg | ORAL_TABLET | Freq: Two times a day (BID) | ORAL | 0 refills | Status: DC

## 2024-05-09 NOTE — ED Triage Notes (Signed)
 Pt states he was mopping a floor today at work and felt a pop in his left wrist. States sometimes the wrist will swell. Pt states he returned from a cruise on Saturday and this morning when he pushed up from the bed he had pain in the same wrist. He has difficulty extending his 4th and fifth fingers and also rotating his wrist. No meds taken. He left work to come here to be seen.

## 2024-05-09 NOTE — Discharge Instructions (Addendum)
 You were seen today for left wrist pain. Your x-ray did not show a fracture, dislocation, or other abnormality, and your symptoms are most consistent with tendonitis. Tendonitis is inflammation of the tendons, which are the tough cords that connect muscles to bones. When tendons become irritated, they can cause pain, stiffness, and discomfort with movement or activity. You have been placed in a wrist splint to provide support and reduce strain. Take the prednisone  and naproxen  as prescribed, and do not use additional over-the-counter NSAIDs such as ibuprofen  or Aleve  while on naproxen . You may use Tylenol  if you need extra pain relief. Apply ice to the wrist for 20 minutes at a time several times a day, using a towel or cloth between the ice and your skin to avoid irritation. Avoid heavy lifting, pushing, or pulling with the affected wrist until your symptoms improve. Most cases of tendonitis improve within a few weeks with rest, medication, and supportive care. Please follow up with an orthopedic specialist if your pain does not improve, interferes with daily activities, or worsens. Go to the emergency department right away if you develop sudden severe pain, inability to move your wrist or fingers, new numbness, tingling, weakness, or changes in color or temperature of your hand.

## 2024-05-09 NOTE — ED Provider Notes (Signed)
 [facility]    CSN: [id number] Arrival date & time: 05/09/24  1219      History   Chief Complaint Chief Complaint  Patient presents with   Wrist Pain    HPI Chris Glover is a 43 y.o. male.   Discussed the use of AI scribe software for clinical note transcription with the patient, who gave verbal consent to proceed.   The patient presents left wrist pain that began this morning while mopping the floor at work. The patient felt a pop in their wrist when using their hands to push themselves up out of bed this morning as well. The pain is localized to the left wrist with radiation that extends up the arm. The patient reports difficulty turning their wrist and has to turn their whole body to compensate. They describe the pain as severe.  The patient denies any recent trauma or heavy lifting, noting they cooked on the grill yesterday but didn't lift anything heavy. They also recently returned from a cruise where they did not experience any wrist issues. They report no swelling in the wrist, just pain. The patient has not tried any treatments for the pain prior to this visit.   The following sections of the patient's history were reviewed and updated as appropriate: allergies, current medications, past family history, past medical history, past social history, past surgical history, and problem list.      Past Medical History:  Diagnosis Date   Asthma     There are no active problems to display for this patient.   Past Surgical History:  Procedure Laterality Date   HERNIA REPAIR         Home Medications    Prior to Admission medications   Medication Sig Start Date End Date Taking? Authorizing Provider  naproxen  (NAPROSYN ) 500 MG tablet Take 1 tablet (500 mg total) by mouth 2 (two) times daily with a meal. Take with food to avoid stomach upset. Do not take any additional NSAIDs while on this. You may take tylenol  in addition to this if needed for extra pain  relief. 05/09/24  Yes Asael Pann, FNP  predniSONE  (DELTASONE ) 20 MG tablet Take 2 tablets (40 mg total) by mouth daily for 5 days. 05/09/24 05/14/24 Yes Iola Lukes, FNP  acetaminophen  (TYLENOL ) 500 MG tablet Take 500 mg by mouth every 6 (six) hours as needed for moderate pain (pain score 4-6).    [provider]    Family History History reviewed. No pertinent family history.  Social History Social History   Tobacco Use   Smoking status: Some Days    Current packs/day: 0.50    Types: Cigarettes  Vaping Use   Vaping status: Never Used  Substance Use Topics   Alcohol use: Yes    Comment: occasionally   Drug use: No     Allergies   Patient has no known allergies.   Review of Systems Review of Systems  Musculoskeletal:  Positive for arthralgias.  Neurological:  Negative for numbness.  All other systems reviewed and are negative.    Physical Exam Triage Vital Signs ED Triage Vitals  Encounter Vitals Group     BP 05/09/24 1307 135/85     Girls Systolic BP Percentile --      Girls Diastolic BP Percentile --      Boys Systolic BP Percentile --      Boys Diastolic BP Percentile --      Pulse Rate 05/09/24 1307 75  Resp 05/09/24 1307 18     Temp 05/09/24 1307 98.4 F (36.9 C)     Temp Source 05/09/24 1307 Oral     SpO2 05/09/24 1307 96 %     Weight --      Height --      Head Circumference --      Peak Flow --      Pain Score 05/09/24 1303 9     Pain Loc --      Pain Education --      Exclude from Growth Chart --    No data found.  Updated Vital Signs BP 135/85 (BP Location: Left Arm)   Pulse 75   Temp 98.4 F (36.9 C) (Oral)   Resp 18   SpO2 96%   Visual Acuity Right Eye Distance:   Left Eye Distance:   Bilateral Distance:    Right Eye Near:   Left Eye Near:    Bilateral Near:     Physical Exam Vitals reviewed.  Constitutional:      General: He is awake. He is not in acute distress.    Appearance: Normal appearance. He  is well-developed. He is not ill-appearing, toxic-appearing or diaphoretic.  HENT:     Head: Normocephalic.     Right Ear: Hearing normal.     Left Ear: Hearing normal.     Nose: Nose normal.     Mouth/Throat:     Mouth: Mucous membranes are moist.  Eyes:     General: Vision grossly intact.     Conjunctiva/sclera: Conjunctivae normal.  Cardiovascular:     Rate and Rhythm: Normal rate and regular rhythm.     Heart sounds: Normal heart sounds.  Pulmonary:     Effort: Pulmonary effort is normal.     Breath sounds: Normal breath sounds and air entry.  Musculoskeletal:     Left wrist: Tenderness present. No swelling, bony tenderness, snuff box tenderness or crepitus. Decreased range of motion.     Left hand: Normal. Normal strength. Normal sensation. Normal capillary refill.     Cervical back: Full passive range of motion without pain, normal range of motion and neck supple.     Comments: Pain along the lateral aspect of the left wrist. There is no swelling observed. Range of motion limited due to pain. Strength and sensation are intact, and neurovascular status is preserved.  Skin:    General: Skin is warm and dry.  Neurological:     General: No focal deficit present.     Mental Status: He is alert and oriented to person, place, and time.  Psychiatric:        Speech: Speech normal.        Behavior: Behavior is cooperative.      UC Treatments / Results  Labs (all labs ordered are listed, but only abnormal results are displayed) Labs Reviewed - No data to display  EKG   Radiology DG Wrist Complete Left Result Date: 05/09/2024 CLINICAL DATA:  Mopping floor today- felt a pop in L wrist- c/o pain and difficulty moving wrist. EXAM: LEFT WRIST - COMPLETE 3+ VIEW COMPARISON:  None Available. FINDINGS: No acute fracture or dislocation. No aggressive osseous lesion. No significant arthritis of the wrist joint. No radiopaque foreign bodies. Soft tissues are within normal limits.  IMPRESSION: No acute osseous abnormality of the left wrist joint. Electronically Signed   By: Ree Molt M.D.   On: 05/09/2024 13:25    Procedures Procedures (including critical care time)  Medications Ordered in UC Medications - No data to display  Initial Impression / Assessment and Plan / UC Course  I have reviewed the triage vital signs and the nursing notes.  Pertinent labs & imaging results that were available during my care of the patient were reviewed by me and considered in my medical decision making (see chart for details).     The patient was evaluated for acute left wrist pain without history of injury. X-rays showed no evidence of acute fracture, dislocation, or other abnormality. Symptoms are most consistent with acute tendonitis. A wrist splint was applied for support, and prescriptions for prednisone  and naproxen  were provided. The patient was advised not to take additional over-the-counter NSAIDs while on naproxen  but may use Tylenol  for extra pain relief if needed. Supportive measures including frequent icing for 20 minutes at a time with a barrier to protect the skin and avoidance of heavy lifting with the affected hand were reviewed. The patient was instructed to follow up with orthopedics if symptoms do not improve or worsen.  Today's evaluation has revealed no signs of a dangerous process. Discussed diagnosis with patient and/or guardian. Patient and/or guardian aware of their diagnosis, possible red flag symptoms to watch out for and need for close follow up. Patient and/or guardian understands verbal and written discharge instructions. Patient and/or guardian comfortable with plan and disposition.  Patient and/or guardian has a clear mental status at this time, good insight into illness (after discussion and teaching) and has clear judgment to make decisions regarding their care  Documentation was completed with the aid of voice recognition software. Transcription may  contain typographical errors.  Final Clinical Impressions(s) / UC Diagnoses   Final diagnoses:  Tendonitis of wrist, left     Discharge Instructions      You were seen today for left wrist pain. Your x-ray did not show a fracture, dislocation, or other abnormality, and your symptoms are most consistent with tendonitis. Tendonitis is inflammation of the tendons, which are the tough cords that connect muscles to bones. When tendons become irritated, they can cause pain, stiffness, and discomfort with movement or activity. You have been placed in a wrist splint to provide support and reduce strain. Take the prednisone  and naproxen  as prescribed, and do not use additional over-the-counter NSAIDs such as ibuprofen  or Aleve  while on naproxen . You may use Tylenol  if you need extra pain relief. Apply ice to the wrist for 20 minutes at a time several times a day, using a towel or cloth between the ice and your skin to avoid irritation. Avoid heavy lifting, pushing, or pulling with the affected wrist until your symptoms improve. Most cases of tendonitis improve within a few weeks with rest, medication, and supportive care. Please follow up with an orthopedic specialist if your pain does not improve, interferes with daily activities, or worsens. Go to the emergency department right away if you develop sudden severe pain, inability to move your wrist or fingers, new numbness, tingling, weakness, or changes in color or temperature of your hand.       ED Prescriptions     Medication Sig Dispense Auth. Provider   naproxen  (NAPROSYN ) 500 MG tablet Take 1 tablet (500 mg total) by mouth 2 (two) times daily with a meal. Take with food to avoid stomach upset. Do not take any additional NSAIDs while on this. You may take tylenol  in addition to this if needed for extra pain relief. 20 tablet Iola Lukes, FNP   predniSONE  (  DELTASONE ) 20 MG tablet Take 2 tablets (40 mg total) by mouth daily for 5 days. 10  tablet Iola Lukes, FNP      PDMP not reviewed this encounter.   Iola Lukes, [place] 05/09/24 1430

## 2024-08-29 ENCOUNTER — Encounter: Payer: Self-pay | Admitting: *Deleted

## 2024-08-29 ENCOUNTER — Ambulatory Visit
Admission: EM | Admit: 2024-08-29 | Discharge: 2024-08-29 | Disposition: A | Discharge status: Home / Self Care | Payer: Self-pay

## 2024-08-29 DIAGNOSIS — T23242A Burn of second degree of multiple left fingers (nail), including thumb, initial encounter: Secondary | ICD-10-CM

## 2024-08-29 MED ORDER — DICLOFENAC SODIUM 75 MG PO TBEC: 75.0000 mg | DELAYED_RELEASE_TABLET | Freq: Two times a day (BID) | ORAL | 0 refills | Status: AC

## 2024-08-29 MED ORDER — LIDOCAINE 5 % EX OINT: 1.0000 | TOPICAL_OINTMENT | CUTANEOUS | 0 refills | Status: AC | PRN

## 2024-08-29 NOTE — ED Triage Notes (Addendum)
 Pt reports he burned his hand at work last night after grabbing the handle of a hot frying pan. 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 When asked if this is workman's comp he states They told me to get it checked out and I can file it later if I need to. Left hand has intact blisters to index and middle fingers. Pt states every finger hurts. Last tetanus 2013.

## 2024-08-29 NOTE — ED Provider Notes (Signed)
 " [facility]    CSN: [id number] Arrival date & time: 08/29/24  0846      History   Chief Complaint Chief Complaint  Patient presents with   Burn    HPI ANSELM AUMILLER is a 44 y.o. male.   Patient presents today due to injury that happened at work last night.  Patient states that the injury happened between 1830 and 1900 yesterday.  Patient states that he accidentally touched a hot frying pan and burned the palm of his left hand.  Patient states that he use cool water and mustard while at work for the burns.  Patient states he is experiencing 9/10 pain of his left hand.  Patient states that he use aloe vera and gauze last night along with 200 mg of Advil  and 500 mg of Tylenol  PM.  Patient has intact blisters of 3 fingers of left hand.  The history is provided by the patient.  Burn   Past Medical History:  Diagnosis Date   Asthma     There are no active problems to display for this patient.   Past Surgical History:  Procedure Laterality Date   HERNIA REPAIR         Home Medications    Prior to Admission medications  Medication Sig Start Date End Date Taking? Authorizing Provider  diclofenac  (VOLTAREN ) 75 MG EC tablet Take 1 tablet (75 mg total) by mouth 2 (two) times daily. 08/29/24  Yes Andra Krabbe C, PA-C  lidocaine  (XYLOCAINE ) 5 % ointment Apply 1 Application topically as needed. 08/29/24  Yes Andra Krabbe C, PA-C  acetaminophen  (TYLENOL ) 500 MG tablet Take 500 mg by mouth every 6 (six) hours as needed for moderate pain (pain score 4-6).    [provider]    Family History No family history on file.  Social History Social History[1]   Allergies   Patient has no known allergies.   Review of Systems Review of Systems   Physical Exam Triage Vital Signs ED Triage Vitals  Encounter Vitals Group     BP 08/29/24 0953 124/81     Girls Systolic BP Percentile --      Girls Diastolic BP Percentile --      Boys Systolic BP  Percentile --      Boys Diastolic BP Percentile --      Pulse Rate 08/29/24 0953 71     Resp 08/29/24 0953 18     Temp 08/29/24 0953 98 F (36.7 C)     Temp Source 08/29/24 0953 Oral     SpO2 08/29/24 0953 95 %     Weight --      Height --      Head Circumference --      Peak Flow --      Pain Score 08/29/24 0951 9     Pain Loc --      Pain Education --      Exclude from Growth Chart --    No data found.  Updated Vital Signs BP 124/81 (BP Location: Right Arm)   Pulse 71   Temp 98 F (36.7 C) (Oral)   Resp 18   SpO2 95%   Visual Acuity Right Eye Distance:   Left Eye Distance:   Bilateral Distance:    Right Eye Near:   Left Eye Near:    Bilateral Near:     Physical Exam Vitals and nursing note reviewed.  Constitutional:      General: He is not  in acute distress.    Appearance: Normal appearance. He is not ill-appearing, toxic-appearing or diaphoretic.  Eyes:     General: No scleral icterus. Cardiovascular:     Rate and Rhythm: Normal rate and regular rhythm.     Heart sounds: Normal heart sounds.  Pulmonary:     Effort: Pulmonary effort is normal. No respiratory distress.     Breath sounds: Normal breath sounds. No wheezing or rhonchi.  Skin:    General: Skin is warm.     Findings: Burn present.     Comments: Second degree burn of left hand, intact blisters noted of 3 finger one of which is his left thumb  Neurological:     Mental Status: He is alert and oriented to person, place, and time.  Psychiatric:        Mood and Affect: Mood normal.        Behavior: Behavior normal.      UC Treatments / Results  Labs (all labs ordered are listed, but only abnormal results are displayed) Labs Reviewed - No data to display  EKG   Radiology No results found.  Procedures Procedures (including critical care time)  Medications Ordered in UC Medications - No data to display  Initial Impression / Assessment and Plan / UC Course  I have reviewed the triage  vital signs and the nursing notes.  Pertinent labs & imaging results that were available during my care of the patient were reviewed by me and considered in my medical decision making (see chart for details).     Final Clinical Impressions(s) / UC Diagnoses   Final diagnoses:  Partial thickness burn of multiple digits of left hand including partial thickness burn of thumb, initial encounter   Discharge Instructions   None    ED Prescriptions     Medication Sig Dispense Auth. Provider   diclofenac  (VOLTAREN ) 75 MG EC tablet Take 1 tablet (75 mg total) by mouth 2 (two) times daily. 30 tablet Ira Busbin C, PA-C   lidocaine  (XYLOCAINE ) 5 % ointment Apply 1 Application topically as needed. 35.44 g Andra Corean BROCKS, PA-C      PDMP not reviewed this encounter.    [1]  Social History Tobacco Use   Smoking status: Every Day    Current packs/day: 0.50    Types: Cigarettes  Vaping Use   Vaping status: Never Used  Substance Use Topics   Alcohol use: Yes    Comment: occasionally   Drug use: No     Andra Corean BROCKS, PA-C 08/29/24 1033  "
